# Patient Record
Sex: Female | Born: 1980 | Race: Black or African American | Hispanic: No | Marital: Married | Smoking: Never smoker
Health system: Southern US, Community
[De-identification: ages and names within clinical notes are randomized; demographics above are authoritative.]

## PROBLEM LIST (undated history)

## (undated) DIAGNOSIS — Z8481 Family history of carrier of genetic disease: Secondary | ICD-10-CM

## (undated) DIAGNOSIS — Z8041 Family history of malignant neoplasm of ovary: Secondary | ICD-10-CM

## (undated) DIAGNOSIS — F329 Major depressive disorder, single episode, unspecified: Secondary | ICD-10-CM

## (undated) DIAGNOSIS — F419 Anxiety disorder, unspecified: Secondary | ICD-10-CM

## (undated) DIAGNOSIS — Z9189 Other specified personal risk factors, not elsewhere classified: Secondary | ICD-10-CM

## (undated) DIAGNOSIS — B009 Herpesviral infection, unspecified: Secondary | ICD-10-CM

## (undated) DIAGNOSIS — Z803 Family history of malignant neoplasm of breast: Secondary | ICD-10-CM

## (undated) DIAGNOSIS — O139 Gestational [pregnancy-induced] hypertension without significant proteinuria, unspecified trimester: Secondary | ICD-10-CM

## (undated) DIAGNOSIS — Z1371 Encounter for nonprocreative screening for genetic disease carrier status: Secondary | ICD-10-CM

## (undated) DIAGNOSIS — L309 Dermatitis, unspecified: Secondary | ICD-10-CM

## (undated) DIAGNOSIS — S62109A Fracture of unspecified carpal bone, unspecified wrist, initial encounter for closed fracture: Secondary | ICD-10-CM

## (undated) DIAGNOSIS — Z800 Family history of malignant neoplasm of digestive organs: Secondary | ICD-10-CM

## (undated) DIAGNOSIS — F32A Depression, unspecified: Secondary | ICD-10-CM

## (undated) HISTORY — DX: Other specified personal risk factors, not elsewhere classified: Z91.89

## (undated) HISTORY — DX: Anxiety disorder, unspecified: F41.9

## (undated) HISTORY — DX: Depression, unspecified: F32.A

## (undated) HISTORY — DX: Family history of malignant neoplasm of digestive organs: Z80.0

## (undated) HISTORY — DX: Family history of malignant neoplasm of breast: Z80.3

## (undated) HISTORY — DX: Dermatitis, unspecified: L30.9

## (undated) HISTORY — PX: WRIST SURGERY: SHX841

## (undated) HISTORY — DX: Encounter for nonprocreative screening for genetic disease carrier status: Z13.71

## (undated) HISTORY — DX: Family history of carrier of genetic disease: Z84.81

## (undated) HISTORY — DX: Herpesviral infection, unspecified: B00.9

## (undated) HISTORY — PX: FRACTURE SURGERY: SHX138

## (undated) HISTORY — DX: Family history of malignant neoplasm of ovary: Z80.41

---
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
## 2003-05-19 ENCOUNTER — Other Ambulatory Visit: Payer: Self-pay

## 2009-04-18 HISTORY — PX: WRIST SURGERY: SHX841

## 2011-11-25 ENCOUNTER — Emergency Department: Payer: Self-pay | Admitting: Emergency Medicine

## 2012-02-25 ENCOUNTER — Emergency Department (HOSPITAL_COMMUNITY)
Admission: EM | Admit: 2012-02-25 | Discharge: 2012-02-25 | Disposition: A | Discharge status: Home / Self Care | Payer: 59 | Attending: Emergency Medicine | Admitting: Emergency Medicine

## 2012-02-25 ENCOUNTER — Emergency Department (HOSPITAL_COMMUNITY): Payer: 59

## 2012-02-25 ENCOUNTER — Encounter (HOSPITAL_COMMUNITY): Payer: Self-pay | Admitting: Emergency Medicine

## 2012-02-25 DIAGNOSIS — Y9289 Other specified places as the place of occurrence of the external cause: Secondary | ICD-10-CM | POA: Insufficient documentation

## 2012-02-25 DIAGNOSIS — S62109A Fracture of unspecified carpal bone, unspecified wrist, initial encounter for closed fracture: Secondary | ICD-10-CM | POA: Insufficient documentation

## 2012-02-25 DIAGNOSIS — R296 Repeated falls: Secondary | ICD-10-CM | POA: Insufficient documentation

## 2012-02-25 DIAGNOSIS — Y9351 Activity, roller skating (inline) and skateboarding: Secondary | ICD-10-CM | POA: Insufficient documentation

## 2012-02-25 HISTORY — DX: Fracture of unspecified carpal bone, unspecified wrist, initial encounter for closed fracture: S62.109A

## 2012-02-25 MED ORDER — OXYCODONE-ACETAMINOPHEN 5-325 MG PO TABS: 1.0000 | ORAL_TABLET | Freq: Four times a day (QID) | ORAL | Status: DC | PRN

## 2012-02-25 MED ORDER — OXYCODONE-ACETAMINOPHEN 5-325 MG PO TABS
2.0000 | ORAL_TABLET | Freq: Once | ORAL | Status: AC
  Administered 2012-02-25: 2 via ORAL
  Filled 2012-02-25: qty 2

## 2012-02-25 NOTE — Progress Notes (Signed)
Orthopedic Tech Progress Note Patient Details:  Angie Tucker 1980-05-29 [id number]  Ortho Devices Type of Ortho Device: Arm foam sling;Sugartong splint Ortho Device/Splint Location: left arm Ortho Device/Splint Interventions: Application   [name] 02/25/2012, 11:23 PM

## 2012-02-25 NOTE — ED Notes (Signed)
Patient reports that she went roller skating today and fell on her left wrist; patient reports pain and swelling.

## 2012-02-25 NOTE — ED Provider Notes (Signed)
Medical screening examination/treatment/procedure(s) were performed by non-physician practitioner and as supervising physician I was immediately available for consultation/collaboration.   Dione Booze, MD 02/25/12 [phone]

## 2012-02-25 NOTE — ED Notes (Signed)
Paged ortho tech to apply short arm splint and sling immobilizer.

## 2012-02-25 NOTE — ED Provider Notes (Addendum)
History     CSN: [id number]  Arrival date & time 02/25/12  2122   First MD Initiated Contact with Patient 02/25/12 2132      Chief Complaint  Patient presents with  . Wrist Injury    (Consider location/radiation/quality/duration/timing/severity/associated sxs/prior treatment) HPI Comments: Larey Seat while rollerskating  The history is provided by the patient.    Past Medical History  Diagnosis Date  . Broken wrist Right Wrist    History reviewed. No pertinent past surgical history.  History reviewed. No pertinent family history.  History  Substance Use Topics  . Smoking status: Never Smoker   . Smokeless tobacco: Not on file  . Alcohol Use: Yes     Comment: Occassional Use    OB History    Grav Para Term Preterm Abortions TAB SAB Ect Mult Living                  Review of Systems  Constitutional: Positive for activity change.  Gastrointestinal: Negative for nausea.  Musculoskeletal: Positive for joint swelling.  Skin: Negative for wound.    Allergies  Review of patient's allergies indicates no known allergies.  Home Medications   Current Outpatient Rx  Name  Route  Sig  Dispense  Refill  . ETONOGESTREL-ETHINYL ESTRADIOL 0.12-0.015 MG/24HR VA RING   Vaginal   Place 1 each vaginally every 28 (twenty-eight) days. Insert vaginally and leave in place for 3 consecutive weeks, then remove for 1 week.         . OXYCODONE-ACETAMINOPHEN 5-325 MG PO TABS   Oral   Take 1 tablet by mouth every 6 (six) hours as needed for pain.   20 tablet   0     BP 132/89  Pulse 95  Temp 97.5 F (36.4 C) (Oral)  SpO2 100%  Physical Exam  Constitutional: She appears well-developed and well-nourished.  HENT:  Head: Normocephalic and atraumatic.  Eyes: Pupils are equal, round, and reactive to light.  Neck: Normal range of motion.  Cardiovascular: Normal rate.   Musculoskeletal: She exhibits tenderness.       Left wrist: She exhibits decreased range of motion,  tenderness, bony tenderness, swelling and deformity.  Neurological: She is alert.  Skin: Skin is warm.    ED Course  SPLINT APPLICATION Date/Time: 02/25/2012 10:59 PM Performed by: Arman Filter Authorized by: Arman Filter Consent: Verbal consent obtained. Consent given by: patient Patient understanding: patient states understanding of the procedure being performed Patient identity confirmed: verbally with patient Time out: Immediately prior to procedure a "time out" was called to verify the correct patient, procedure, equipment, support staff and site/side marked as required. Location details: left wrist Splint type: sugar tong Post-procedure: The splinted body part was neurovascularly unchanged following the procedure. Patient tolerance: Patient tolerated the procedure well with no immediate complications. Comments: Slint placed by Ortho tech   (including critical care time)  Labs Reviewed - No data to display Dg Wrist Complete Left  02/25/2012  *RADIOLOGY REPORT*  Clinical Data: Post fall onto wrist  LEFT WRIST - COMPLETE 3+ VIEW  Comparison: None.  Findings: There is a displaced fracture of the distal radial metaphysis, apex ventral.  No definite intra-articular extension. Minimally displaced fracture of the ulnar styloid process.  There is expected adjacent soft tissue swelling and displacement of the pronator quadratus fat pad.  No definite dislocation.  No radiopaque foreign body.  IMPRESSION: 1.  Displaced fracture of the distal radial metaphysis, apex ventral, without definite intra-articular extension. 2.  Minimally-displaced fracture of the ulnar styloid process.   Original Report Authenticated By: Tacey Ruiz, MD      1. Wrist fracture       MDM   I spoke with Dr. Reita Cliche, who will review the film at this time.  He is requesting a sugar tong splint, be placed living and pain medication        Arman Filter, NP 02/25/12 2300  Arman Filter, NP 02/25/12  2300  Arman Filter, NP 02/25/12 4540  Arman Filter, NP 03/07/12 9811

## 2013-03-13 ENCOUNTER — Other Ambulatory Visit: Payer: Self-pay | Admitting: Family Medicine

## 2013-03-13 DIAGNOSIS — Z803 Family history of malignant neoplasm of breast: Secondary | ICD-10-CM

## 2013-03-21 ENCOUNTER — Other Ambulatory Visit: Payer: 59

## 2013-04-01 ENCOUNTER — Other Ambulatory Visit: Payer: 59

## 2013-04-01 ENCOUNTER — Ambulatory Visit
Admission: RE | Admit: 2013-04-01 | Discharge: 2013-04-01 | Disposition: A | Discharge status: Home / Self Care | Payer: 59 | Source: Ambulatory Visit | Attending: Family Medicine | Admitting: Family Medicine

## 2013-04-01 DIAGNOSIS — Z803 Family history of malignant neoplasm of breast: Secondary | ICD-10-CM

## 2013-04-01 MED ORDER — GADOBENATE DIMEGLUMINE 529 MG/ML IV SOLN
13.0000 mL | Freq: Once | INTRAVENOUS | Status: AC | PRN
  Administered 2013-04-01: 13 mL via INTRAVENOUS

## 2014-01-08 ENCOUNTER — Emergency Department (HOSPITAL_COMMUNITY): Payer: 59

## 2014-01-08 ENCOUNTER — Encounter (HOSPITAL_COMMUNITY): Payer: Self-pay | Admitting: Emergency Medicine

## 2014-01-08 ENCOUNTER — Emergency Department (HOSPITAL_COMMUNITY)
Admission: EM | Admit: 2014-01-08 | Discharge: 2014-01-08 | Disposition: A | Discharge status: Home / Self Care | Payer: 59 | Attending: Emergency Medicine | Admitting: Emergency Medicine

## 2014-01-08 DIAGNOSIS — Z8781 Personal history of (healed) traumatic fracture: Secondary | ICD-10-CM | POA: Diagnosis not present

## 2014-01-08 DIAGNOSIS — IMO0002 Reserved for concepts with insufficient information to code with codable children: Secondary | ICD-10-CM | POA: Insufficient documentation

## 2014-01-08 DIAGNOSIS — R002 Palpitations: Secondary | ICD-10-CM | POA: Diagnosis not present

## 2014-01-08 DIAGNOSIS — R000 Tachycardia, unspecified: Secondary | ICD-10-CM | POA: Insufficient documentation

## 2014-01-08 DIAGNOSIS — R7989 Other specified abnormal findings of blood chemistry: Secondary | ICD-10-CM | POA: Diagnosis not present

## 2014-01-08 DIAGNOSIS — Z79899 Other long term (current) drug therapy: Secondary | ICD-10-CM | POA: Diagnosis not present

## 2014-01-08 DIAGNOSIS — R748 Abnormal levels of other serum enzymes: Secondary | ICD-10-CM

## 2014-01-08 DIAGNOSIS — F411 Generalized anxiety disorder: Secondary | ICD-10-CM | POA: Insufficient documentation

## 2014-01-08 LAB — CBC WITH DIFFERENTIAL/PLATELET
Basophils Absolute: 0 10*3/uL (ref 0.0–0.1)
Basophils Relative: 0 % (ref 0–1)
Eosinophils Absolute: 0.1 10*3/uL (ref 0.0–0.7)
Eosinophils Relative: 1 % (ref 0–5)
HEMATOCRIT: 38.6 % (ref 36.0–46.0)
HEMOGLOBIN: 13.2 g/dL (ref 12.0–15.0)
LYMPHS ABS: 1.5 10*3/uL (ref 0.7–4.0)
LYMPHS PCT: 23 % (ref 12–46)
MCH: 30 pg (ref 26.0–34.0)
MCHC: 34.2 g/dL (ref 30.0–36.0)
MCV: 87.7 fL (ref 78.0–100.0)
MONO ABS: 0.4 10*3/uL (ref 0.1–1.0)
MONOS PCT: 5 % (ref 3–12)
NEUTROS ABS: 4.6 10*3/uL (ref 1.7–7.7)
NEUTROS PCT: 71 % (ref 43–77)
Platelets: 222 10*3/uL (ref 150–400)
RBC: 4.4 MIL/uL (ref 3.87–5.11)
RDW: 13.3 % (ref 11.5–15.5)
WBC: 6.6 10*3/uL (ref 4.0–10.5)

## 2014-01-08 LAB — D-DIMER, QUANTITATIVE: D-Dimer, Quant: 0.41 ug/mL-FEU (ref 0.00–0.48)

## 2014-01-08 LAB — COMPREHENSIVE METABOLIC PANEL
ALBUMIN: 3.4 g/dL — AB (ref 3.5–5.2)
ALK PHOS: 87 U/L (ref 39–117)
ALT: 122 U/L — ABNORMAL HIGH (ref 0–35)
ANION GAP: 16 — AB (ref 5–15)
AST: 117 U/L — ABNORMAL HIGH (ref 0–37)
BILIRUBIN TOTAL: 0.9 mg/dL (ref 0.3–1.2)
BUN: 7 mg/dL (ref 6–23)
CHLORIDE: 101 meq/L (ref 96–112)
CO2: 22 meq/L (ref 19–32)
Calcium: 8.5 mg/dL (ref 8.4–10.5)
Creatinine, Ser: 0.74 mg/dL (ref 0.50–1.10)
GLUCOSE: 130 mg/dL — AB (ref 70–99)
POTASSIUM: 3.5 meq/L — AB (ref 3.7–5.3)
Sodium: 139 mEq/L (ref 137–147)
Total Protein: 7.3 g/dL (ref 6.0–8.3)

## 2014-01-08 LAB — TSH: TSH: 1.45 u[IU]/mL (ref 0.350–4.500)

## 2014-01-08 LAB — I-STAT TROPONIN, ED: Troponin i, poc: 0 ng/mL (ref 0.00–0.08)

## 2014-01-08 MED ORDER — LORAZEPAM 2 MG/ML IJ SOLN
1.0000 mg | Freq: Once | INTRAMUSCULAR | Status: AC
  Administered 2014-01-08: 1 mg via INTRAVENOUS
  Filled 2014-01-08: qty 1

## 2014-01-08 MED ORDER — SODIUM CHLORIDE 0.9 % IV BOLUS (SEPSIS)
1000.0000 mL | Freq: Once | INTRAVENOUS | Status: AC
  Administered 2014-01-08: 1000 mL via INTRAVENOUS

## 2014-01-08 NOTE — Discharge Instructions (Signed)
Take your meds as prescribed.   Stay hydrated.   Your AST is 117 and ALT is 122, slightly elevated. You should follow up and get a recheck with your doctor regarding this.   Return to ER if you have palpitations, shortness of breath, chest pain.

## 2014-01-08 NOTE — ED Provider Notes (Signed)
CSN: [id number]     Arrival date & time 01/08/14  1714 History   First MD Initiated Contact with Patient 01/08/14 1715     Chief Complaint  Patient presents with  . Tachycardia     (Consider location/radiation/quality/duration/timing/severity/associated sxs/prior Treatment) The history is provided by the patient.  Angie Tucker is a 33 y.o. female here with palpitations, shortness of breath. Woke up from a nap around 3:30 PM today with palpitations and shortness of breath. She works as a Engineer, civil (consulting) and listened to her heart rate and noticed that it was high. Denies chest pain, just shortness of breath. Denies vomiting. On OCP but denies PE or DVT or recent travels. Went to urgent care and was noted to be tachy in 160s and hypertensive 200/100. Sent here for eval. Has hx of anxiety.    Past Medical History  Diagnosis Date  . Broken wrist Right Wrist   Past Surgical History  Procedure Laterality Date  . Fracture surgery     History reviewed. No pertinent family history. History  Substance Use Topics  . Smoking status: Never Smoker   . Smokeless tobacco: Not on file  . Alcohol Use: Yes     Comment: Occassional Use   OB History   Grav Para Term Preterm Abortions TAB SAB Ect Mult Living                 Review of Systems  Respiratory: Positive for shortness of breath.   Cardiovascular: Positive for palpitations.  All other systems reviewed and are negative.     Allergies  Review of patient's allergies indicates no known allergies.  Home Medications   Prior to Admission medications   Medication Sig Start Date End Date Taking? Authorizing Provider  etonogestrel-ethinyl estradiol (NUVARING) 0.12-0.015 MG/24HR vaginal ring Place 1 each vaginally every 28 (twenty-eight) days. Insert vaginally and leave in place for 3 consecutive weeks, then remove for 1 week.   Yes Historical Provider, MD  fluticasone (FLONASE) 50 MCG/ACT nasal spray Place 1 spray into both nostrils daily.   Yes  Historical Provider, MD  hydrOXYzine (ATARAX/VISTARIL) 25 MG tablet Take 25 mg by mouth 3 (three) times daily as needed for anxiety.   Yes Historical Provider, MD  PARoxetine (PAXIL) 20 MG tablet Take 20 mg by mouth daily.   Yes Historical Provider, MD   BP 164/85  Pulse 93  Temp(Src) 98.1 F (36.7 C) (Oral)  Resp 22  Ht  (1.6 m)  Wt 135 lb (61.236 kg)  BMI 23.92 kg/m2  SpO2 100% Physical Exam  Nursing note and vitals reviewed. Constitutional: She is oriented to person, place, and time. She appears well-developed and well-nourished.  Slightly anxious   HENT:  Head: Normocephalic.  Mouth/Throat: Oropharynx is clear and moist.  Eyes: Conjunctivae and EOM are normal. Pupils are equal, round, and reactive to light.  Neck: Normal range of motion. Neck supple.  Cardiovascular: Regular rhythm and normal heart sounds.   Tachycardic   Pulmonary/Chest: Effort normal and breath sounds normal. No respiratory distress. She has no wheezes. She has no rales.  Abdominal: Soft. Bowel sounds are normal. She exhibits no distension. There is no tenderness. There is no rebound and no guarding.  Musculoskeletal: Normal range of motion. She exhibits no edema and no tenderness.  Neurological: She is alert and oriented to person, place, and time. No cranial nerve deficit. Coordination normal.  Skin: Skin is warm and dry.  Psychiatric: She has a normal mood and affect. Her behavior  is normal. Judgment and thought content normal.    ED Course  Procedures (including critical care time) Labs Review Labs Reviewed  COMPREHENSIVE METABOLIC PANEL - Abnormal; Notable for the following:    Potassium 3.5 (*)    Glucose, Bld 130 (*)    Albumin 3.4 (*)    AST 117 (*)    ALT 122 (*)    Anion gap 16 (*)    All other components within normal limits  CBC WITH DIFFERENTIAL  D-DIMER, QUANTITATIVE  TSH  I-STAT TROPOININ, ED    Imaging Review Dg Chest 2 View  01/08/2014   CLINICAL DATA:  Tachycardia,  shortness of breath, nonsmoker  EXAM: CHEST  2 VIEW  COMPARISON:  05/02/2013  FINDINGS: The heart size and mediastinal contours are within normal limits. Both lungs are clear. The visualized skeletal structures are unremarkable.  IMPRESSION: No active cardiopulmonary disease.   Electronically Signed   By: Esperanza Heir M.D.   On: 01/08/2014 19:38     EKG Interpretation   Date/Time:  Wednesday January 08 2014 17:18:22 EDT Ventricular Rate:  105 PR Interval:  175 QRS Duration: 73 QT Interval:  331 QTC Calculation: 437 R Axis:   64 Text Interpretation:  Sinus tachycardia Probable left atrial enlargement  Anterior infarct, old No previous ECGs available Confirmed by Deijah Spikes  MD,  Brinnley Lacap ([id number]) on 01/08/2014 5:23:41 PM      MDM   Final diagnoses:  None    Angie Tucker is a 33 y.o. female here with palpitations, SOB. Likely anxiety. On OCP so will get d-dimer. Will check labs. I doubt ACS.   8:03 PM Labs showed minimally elevated AST and ALT. No abdominal pain or vomiting. I doubt acute chole. D-dimer neg. TSH nl. After 1L NS and ativan, HR in the 80s. I think likely anxiety. Request outpatient f/u for elevated LFTs.    Richardean Canal, MD 01/08/14 2004

## 2014-01-08 NOTE — ED Notes (Signed)
33 yo female from [facility] with tachycardia beginning at 3:30 pm. Per UCC HR 164 with bp 200/110 now 160/100. Per EMS ST and numbness on the right side of face. Pt alert and oriented. Denies any pain.

## 2014-07-22 ENCOUNTER — Encounter: Payer: Self-pay | Admitting: *Deleted

## 2015-01-16 ENCOUNTER — Ambulatory Visit: Payer: Self-pay | Admitting: Family

## 2015-01-16 ENCOUNTER — Other Ambulatory Visit: Payer: Self-pay | Admitting: Family

## 2015-01-16 ENCOUNTER — Encounter: Payer: Self-pay | Admitting: Physician Assistant

## 2015-01-16 VITALS — BP 138/90 | Temp 98.6°F

## 2015-01-16 DIAGNOSIS — F4321 Adjustment disorder with depressed mood: Secondary | ICD-10-CM

## 2015-01-16 MED ORDER — FLUOXETINE HCL 20 MG PO TABS: 20.0000 mg | ORAL_TABLET | Freq: Every day | ORAL | Status: DC

## 2015-01-16 NOTE — Progress Notes (Signed)
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 EAP offered and accepted. RTC one mo for follow up or prn. Continue exercise , healthy eating .

## 2015-02-08 ENCOUNTER — Emergency Department
Admission: EM | Admit: 2015-02-08 | Discharge: 2015-02-09 | Disposition: A | Discharge status: Home / Self Care | Payer: Self-pay | Attending: Emergency Medicine | Admitting: Emergency Medicine

## 2015-02-08 ENCOUNTER — Encounter: Payer: Self-pay | Admitting: *Deleted

## 2015-02-08 DIAGNOSIS — T50905A Adverse effect of unspecified drugs, medicaments and biological substances, initial encounter: Secondary | ICD-10-CM

## 2015-02-08 DIAGNOSIS — T783XXA Angioneurotic edema, initial encounter: Secondary | ICD-10-CM | POA: Insufficient documentation

## 2015-02-08 DIAGNOSIS — T450X5A Adverse effect of antiallergic and antiemetic drugs, initial encounter: Secondary | ICD-10-CM | POA: Insufficient documentation

## 2015-02-08 DIAGNOSIS — Z79899 Other long term (current) drug therapy: Secondary | ICD-10-CM | POA: Insufficient documentation

## 2015-02-08 MED ORDER — DIPHENHYDRAMINE HCL 50 MG/ML IJ SOLN
25.0000 mg | Freq: Once | INTRAMUSCULAR | Status: AC
  Administered 2015-02-08: 25 mg via INTRAVENOUS
  Filled 2015-02-08: qty 1

## 2015-02-08 MED ORDER — METHYLPREDNISOLONE 4 MG PO TBPK: ORAL_TABLET | ORAL | Status: DC

## 2015-02-08 MED ORDER — METOCLOPRAMIDE HCL 5 MG/ML IJ SOLN
INTRAMUSCULAR | Status: AC
Start: 2015-02-08 — End: 2015-02-08
  Administered 2015-02-08: 10 mg
  Filled 2015-02-08: qty 2

## 2015-02-08 MED ORDER — DEXAMETHASONE SODIUM PHOSPHATE 10 MG/ML IJ SOLN
10.0000 mg | Freq: Once | INTRAMUSCULAR | Status: AC
  Administered 2015-02-08: 10 mg via INTRAVENOUS
  Filled 2015-02-08: qty 1

## 2015-02-08 MED ORDER — SODIUM CHLORIDE 0.9 % IV BOLUS (SEPSIS)
1000.0000 mL | Freq: Once | INTRAVENOUS | Status: AC
  Administered 2015-02-08: 1000 mL via INTRAVENOUS

## 2015-02-08 MED ORDER — METOCLOPRAMIDE HCL 5 MG/ML IJ SOLN: 20.0000 mg | Freq: Once | INTRAVENOUS | Status: AC

## 2015-02-08 MED ORDER — HYDROXYZINE HCL 50 MG PO TABS: 50.0000 mg | ORAL_TABLET | Freq: Three times a day (TID) | ORAL | Status: DC | PRN

## 2015-02-08 NOTE — ED Notes (Signed)
Pt states she received a flu shot in her left arm. Last night she started having itching on her left arm and today started having a rash on her arms and legs. She had taken some benadryl for the same. About 8pm had onset of left lower lip swelling. Pt denies sob, swallowing, or chest discomfort. Last Benadryl at 6pm tonight. No acute distress noted.

## 2015-02-08 NOTE — ED Notes (Signed)
Pt states she got the flu shot yesterday and started getting spots that itched on her body but states it wasn't that bad. Pt states she went to ed to be seen was given benadryl and then went back to work but states they only get worse. Pt has red welts all over her body

## 2015-02-08 NOTE — ED Provider Notes (Signed)
[facility] Emergency Department Provider Note  ____________________________________________  Time seen: Approximately 10:12 PM  I have reviewed the triage vital signs and the nursing notes.   HISTORY  Chief Complaint Allergic Reaction    HPI Angie Tucker is a 34 y.o. female patient receive a flu shot in the left arm yesterday. Last night patient starteditching her left arm. They were at the patient knows a rash on her arms and legs. Patient took some Benadryl and went home from work. About 8 PM today the patient noticed lower lip edema. Patient denies dyspnea, dysphagia, or chest discomfort. Patient denies any pain with this complaint. Patient stated  no fever, nausea, vomiting, or diarrhea.   Past Medical History  Diagnosis Date  . Broken wrist Right Wrist    There are no active problems to display for this patient.   Past Surgical History  Procedure Laterality Date  . Fracture surgery      Current Outpatient Rx  Name  Route  Sig  Dispense  Refill  . etonogestrel-ethinyl estradiol (NUVARING) 0.12-0.015 MG/24HR vaginal ring   Vaginal   Place 1 each vaginally every 28 (twenty-eight) days. Insert vaginally and leave in place for 3 consecutive weeks, then remove for 1 week.         Marland Kitchen. FLUoxetine (PROZAC) 20 MG tablet   Oral   Take 1 tablet (20 mg total) by mouth daily.   30 tablet   0   . fluticasone (FLONASE) 50 MCG/ACT nasal spray   Each Nare   Place 1 spray into both nostrils daily.         . hydrOXYzine (ATARAX/VISTARIL) 25 MG tablet   Oral   Take 25 mg by mouth 3 (three) times daily as needed for anxiety.         . hydrOXYzine (ATARAX/VISTARIL) 50 MG tablet   Oral   Take 1 tablet (50 mg total) by mouth 3 (three) times daily as needed for itching.   15 tablet   0   . methylPREDNISolone (MEDROL DOSEPAK) 4 MG TBPK tablet      Take Tapered dose as directed   21 tablet   0     Allergies Review of patient's allergies  indicates no known allergies.  No family history on file.  Social History Social History  Substance Use Topics  . Smoking status: Never Smoker   . Smokeless tobacco: None  . Alcohol Use: Yes     Comment: Occassional Use    Review of Systems Constitutional: No fever/chills Eyes: No visual changes. ENT: No sore throat. Cardiovascular: Denies chest pain. Respiratory: Denies shortness of breath. Gastrointestinal: No abdominal pain.  No nausea, no vomiting.  No diarrhea.  No constipation. Genitourinary: Negative for dysuria. Musculoskeletal: Negative for back pain. Skin: Positive for rash and lower lip edema. Neurological: Negative for headaches, focal weakness or numbness. 10-point ROS otherwise negative.  ____________________________________________   PHYSICAL EXAM:  VITAL SIGNS: ED Triage Vitals  Enc Vitals Group     BP 02/08/15 2048 167/87 mmHg     Pulse Rate 02/08/15 2048 89     Resp 02/08/15 2048 16     Temp 02/08/15 2048 98.1 F (36.7 C)     Temp Source 02/08/15 2048 Oral     SpO2 02/08/15 2048 100 %     Weight 02/08/15 2048 168 lb (76.204 kg)     Height 02/08/15 2048 5\' 2"  (1.575 m)     Head Cir --  Peak Flow --      Pain Score 02/08/15 2050 0     Pain Loc --      Pain Edu? --      Excl. in GC? --     Constitutional: Alert and oriented. Well appearing and in no acute distress. Eyes: Conjunctivae are normal. PERRL. EOMI. Head: Atraumatic. Nose: No congestion/rhinnorhea. Mouth/Throat: Mucous membranes are moist.  Oropharynx non-erythematous. Neck: No stridor.  No cervical spine tenderness to palpation. Hematological/Lymphatic/Immunilogical: No cervical lymphadenopathy. Cardiovascular: Normal rate, regular rhythm. Grossly normal heart sounds.  Good peripheral circulation. Respiratory: Normal respiratory effort.  No retractions. Lungs CTAB. Gastrointestinal: Soft and nontender. No distention. No abdominal bruits. No CVA tenderness. Musculoskeletal: No  lower extremity tenderness nor edema.  No joint effusions. Neurologic:  Normal speech and language. No gross focal neurologic deficits are appreciated. No gait instability. Skin:  Skin is warm, dry and intact. No rash noted. Psychiatric: Mood and affect are normal. Speech and behavior are normal.  ____________________________________________   LABS (all labs ordered are listed, but only abnormal results are displayed)  Labs Reviewed - No data to display ____________________________________________  EKG   ____________________________________________  RADIOLOGY   ____________________________________________   PROCEDURES  Procedure(s) performed: None  Critical Care performed: No  ____________________________________________   INITIAL IMPRESSION / ASSESSMENT AND PLAN / ED COURSE  Pertinent labs & imaging results that were available during my care of the patient were reviewed by me and considered in my medical decision making (see chart for details).  Angioedema and medication reaction. Patient given IV Decadron, Benadryl, and Reglan. Patient state itching has resolved there is a grade 57 resolution of the angioedema of the lip. Patient will be discharged prescription for Atarax and prednisone. Patient given a work excuse and advised follow-up family doctor to advise him of this encounter. ____________________________________________   FINAL CLINICAL IMPRESSION(S) / ED DIAGNOSES  Final diagnoses:  Angioedema of lips, initial encounter  Medication reaction, initial encounter      Joni Reining, PA-C 02/08/15 2353  Darien Ramus, MD 02/08/15 [phone]

## 2015-02-08 NOTE — ED Notes (Signed)
Pt says she got her flu shot yesterday; soon after she began having a rash to her left arm where injection was given; now she is having swelling to her bottom left; denies difficulty breathing;

## 2015-02-08 NOTE — Discharge Instructions (Signed)
Angioedema  Angioedema is sudden puffiness (swelling), often of the skin. It can happen:  · On your face or privates (genitals).  · In your belly (abdomen) or other body parts.  It usually happens quickly and gets better in 1 or 2 days. It often starts at night and is found when you wake up. You may get red, itchy patches of skin (hives). Attacks can be dangerous if your breathing passages get puffy.  The condition may happen only once, or it can come back at random times. It may happen for several years before it goes away for good.  HOME CARE  · Only take medicines as told by your doctor.  · Always carry your emergency allergy medicines with you.  · Wear a medical bracelet as told by your doctor.  · Avoid things that you know will cause attacks (triggers).  GET HELP IF:  · You have another attack.  · Your attacks happen more often or get worse.  · The condition was passed to you by your parents and you want to have children.  GET HELP RIGHT AWAY IF:   · Your mouth, tongue, or lips are very puffy.  · You have trouble breathing.  · You have trouble swallowing.  · You pass out (faint).  MAKE SURE YOU:   · Understand these instructions.  · Will watch your condition.  · Will get help right away if you are not doing well or get worse.     This information is not intended to replace advice given to you by your health care provider. Make sure you discuss any questions you have with your health care provider.     Document Released: 03/23/2009 Document Revised: 01/23/2013 Document Reviewed: 11/26/2012  Elsevier Interactive Patient Education ©2016 [employer]

## 2015-02-20 ENCOUNTER — Ambulatory Visit: Payer: Self-pay | Admitting: Family

## 2015-02-20 ENCOUNTER — Encounter: Payer: Self-pay | Admitting: Family

## 2015-02-20 VITALS — BP 140/99 | HR 101 | Temp 98.4°F

## 2015-02-20 DIAGNOSIS — F4321 Adjustment disorder with depressed mood: Secondary | ICD-10-CM

## 2015-02-20 DIAGNOSIS — F5102 Adjustment insomnia: Secondary | ICD-10-CM

## 2015-02-20 MED ORDER — FLUOXETINE HCL 20 MG PO TABS: 20.0000 mg | ORAL_TABLET | Freq: Every day | ORAL | Status: DC

## 2015-02-20 MED ORDER — ALPRAZOLAM 0.5 MG PO TABS: 0.5000 mg | ORAL_TABLET | Freq: Two times a day (BID) | ORAL | Status: DC | PRN

## 2015-02-20 NOTE — Progress Notes (Signed)
S / mood  , depression better  , not as tearful, denies H& S ideas, school going well . Can not sleep due to thinking about situational things with her marriage breakup  O/ alert pleasant , verbalises well, less depressed appearing, anxious  VSS A/ situational depression  insomnia P continue Prozac 20 mg rx for xanex 0.5 mg one half to one tab bid prn #20 O RF  EAP encouraged. Continue cardio and healthy lifestyle behaviors. RTC one month.

## 2015-03-24 ENCOUNTER — Encounter: Payer: Self-pay | Admitting: Physician Assistant

## 2015-03-24 ENCOUNTER — Ambulatory Visit: Payer: Self-pay | Admitting: Physician Assistant

## 2015-03-24 VITALS — BP 130/80 | HR 89 | Temp 98.2°F

## 2015-03-24 DIAGNOSIS — J069 Acute upper respiratory infection, unspecified: Secondary | ICD-10-CM

## 2015-03-24 MED ORDER — AZITHROMYCIN 250 MG PO TABS: ORAL_TABLET | ORAL | Status: DC

## 2015-03-24 MED ORDER — BENZONATATE 200 MG PO CAPS: 200.0000 mg | ORAL_CAPSULE | Freq: Three times a day (TID) | ORAL | Status: DC | PRN

## 2015-03-24 NOTE — Progress Notes (Signed)
S: C/o runny nose and congestion for 4-5 days, no fever, chills, cp/sob, v/d; mucus was green this am but clear throughout the day, cough is sporadic, voice is hoarse Using otc meds:   O: PE: vitals wnl, nad,  perrl eomi, normocephalic, tms dull, nasal mucosa red and swollen, throat injected, neck supple no lymph, lungs c t a, cv rrr, neuro intact, voice is hoarse  A:  Acute uri   P: zpack, tessalon perls; drink fluids, continue regular meds , use otc meds of choice, return if not improving in 5 days, return earlier if worsening

## 2015-03-25 ENCOUNTER — Ambulatory Visit: Payer: Self-pay | Admitting: Physician Assistant

## 2015-03-25 ENCOUNTER — Encounter: Payer: Self-pay | Admitting: Physician Assistant

## 2015-03-25 VITALS — BP 130/90 | HR 80 | Temp 98.3°F

## 2015-03-25 DIAGNOSIS — J069 Acute upper respiratory infection, unspecified: Secondary | ICD-10-CM

## 2015-03-25 NOTE — Progress Notes (Signed)
S/ sore throat , temp 100 . 7 last nt on zpack   O/  VSS congested , Pharynx with increased PND, minimally inflamed  A/ Sore throat secondary to PND  P saline products , continue supportive care . Reassurance.

## 2015-05-15 ENCOUNTER — Encounter: Payer: Self-pay | Admitting: Physician Assistant

## 2015-05-15 ENCOUNTER — Ambulatory Visit: Payer: Self-pay | Admitting: Physician Assistant

## 2015-05-15 VITALS — BP 124/90 | HR 80 | Temp 98.8°F

## 2015-05-15 DIAGNOSIS — R509 Fever, unspecified: Secondary | ICD-10-CM

## 2015-05-15 DIAGNOSIS — J069 Acute upper respiratory infection, unspecified: Secondary | ICD-10-CM

## 2015-05-15 LAB — POCT INFLUENZA A/B
Influenza A, POC: NEGATIVE
Influenza B, POC: NEGATIVE

## 2015-05-15 LAB — POCT RAPID STREP A (OFFICE): Rapid Strep A Screen: NEGATIVE

## 2015-05-15 MED ORDER — HYDROCOD POLST-CPM POLST ER 10-8 MG/5ML PO SUER: 5.0000 mL | Freq: Two times a day (BID) | ORAL | Status: DC | PRN

## 2015-05-15 MED ORDER — FLUCONAZOLE 150 MG PO TABS: ORAL_TABLET | ORAL | Status: DC

## 2015-05-15 MED ORDER — CEFDINIR 300 MG PO CAPS: 300.0000 mg | ORAL_CAPSULE | Freq: Two times a day (BID) | ORAL | Status: DC

## 2015-05-15 NOTE — Progress Notes (Signed)
S: C/o runny nose and congestion for 3 days, ?fever, chills,  Denies cp/sob, v/d; mucus was green this am but clear throughout the day, cough is sporadic, sister has same sx  Using otc meds: robitussin  O: PE: vitals wnl, nad, perrl eomi, normocephalic, tms dull, nasal mucosa red and swollen, throat injected, neck supple no lymph, lungs c t a, cv rrr, neuro intact  A:  Acute uri   P: omnicef , diflucan, tussionex nr; drink fluids, continue regular meds , use otc meds of choice, return if not improving in 5 days, return earlier if worsening

## 2015-09-21 ENCOUNTER — Encounter: Payer: Self-pay | Admitting: Physician Assistant

## 2015-09-21 ENCOUNTER — Ambulatory Visit: Payer: Self-pay | Admitting: Physician Assistant

## 2015-09-21 VITALS — BP 140/94 | HR 100 | Temp 98.3°F

## 2015-09-21 DIAGNOSIS — F4321 Adjustment disorder with depressed mood: Secondary | ICD-10-CM

## 2015-09-21 MED ORDER — SERTRALINE HCL 50 MG PO TABS
50.0000 mg | ORAL_TABLET | Freq: Every day | ORAL | Status: DC
Start: 2015-09-21 — End: 2018-11-14

## 2015-09-21 MED ORDER — TRAZODONE HCL 150 MG PO TABS: 150.0000 mg | ORAL_TABLET | Freq: Every evening | ORAL | Status: DC | PRN

## 2015-09-21 NOTE — Progress Notes (Signed)
S: c/o anxiety/depression, has been on prozac since Aug, states she got better for awhile but never felt like the medication was really helping a lot, now the anxiety is increasing again and can't sleep bc is thinking about everything at night, problems with her ex husband are still going on, denies si/hi  O: vitals wnl, nad, lungs c t a, cv rrr, good mood/affect  A: anxiety/depression  P: stop prozac, switch to zoloft 50mg  qd, trazadone 150mg  qhs prn, f/u with EAP

## 2015-11-18 ENCOUNTER — Encounter: Payer: Self-pay | Admitting: Physician Assistant

## 2015-11-18 ENCOUNTER — Ambulatory Visit: Payer: Self-pay | Admitting: Physician Assistant

## 2015-11-18 VITALS — BP 100/65 | HR 85 | Temp 98.3°F

## 2015-11-18 DIAGNOSIS — N390 Urinary tract infection, site not specified: Secondary | ICD-10-CM

## 2015-11-18 LAB — POCT URINALYSIS DIPSTICK
Bilirubin, UA: NEGATIVE
GLUCOSE UA: NEGATIVE
KETONES UA: NEGATIVE
Nitrite, UA: POSITIVE
Spec Grav, UA: 1.02
Urobilinogen, UA: 2
pH, UA: 6

## 2015-11-18 MED ORDER — CIPROFLOXACIN HCL 250 MG PO TABS: 250.0000 mg | ORAL_TABLET | Freq: Two times a day (BID) | ORAL | 0 refills | Status: DC

## 2015-11-18 NOTE — Progress Notes (Signed)
S:  C/o uti sx for 2 days, burning, urgency, frequency, pressure,  denies vaginal discharge, abdominal pain or flank pain:  Remainder ros neg  O:  Vitals wnl, nad, no cva tenderness, back nontender, lungs c t a,cv rrr, abd soft nontender, bs normal, n/v intact, ua + nitrites, trace leuks, 1+ blood  A: uti  P: cipro 250mg  bid x 7d, increase water intake, add cranberry juice, return if not improving in 2 -3 days, return earlier if worsening, discussed pyelonephritis sx

## 2016-04-19 DIAGNOSIS — Z0000 Encounter for general adult medical examination without abnormal findings: Secondary | ICD-10-CM | POA: Diagnosis not present

## 2016-04-19 DIAGNOSIS — R87612 Low grade squamous intraepithelial lesion on cytologic smear of cervix (LGSIL): Secondary | ICD-10-CM | POA: Diagnosis not present

## 2016-04-19 DIAGNOSIS — F418 Other specified anxiety disorders: Secondary | ICD-10-CM | POA: Diagnosis not present

## 2016-04-19 DIAGNOSIS — Z3044 Encounter for surveillance of vaginal ring hormonal contraceptive device: Secondary | ICD-10-CM | POA: Diagnosis not present

## 2016-04-19 DIAGNOSIS — Z124 Encounter for screening for malignant neoplasm of cervix: Secondary | ICD-10-CM | POA: Diagnosis not present

## 2016-04-19 DIAGNOSIS — A6000 Herpesviral infection of urogenital system, unspecified: Secondary | ICD-10-CM | POA: Diagnosis not present

## 2016-06-15 ENCOUNTER — Encounter: Payer: Self-pay | Admitting: Physician Assistant

## 2016-06-15 ENCOUNTER — Ambulatory Visit: Payer: Self-pay | Admitting: Physician Assistant

## 2016-06-15 VITALS — BP 120/90 | HR 95 | Temp 98.8°F

## 2016-06-15 DIAGNOSIS — J0120 Acute ethmoidal sinusitis, unspecified: Secondary | ICD-10-CM

## 2016-06-15 MED ORDER — FEXOFENADINE-PSEUDOEPHED ER 60-120 MG PO TB12: 1.0000 | ORAL_TABLET | Freq: Two times a day (BID) | ORAL | 0 refills | Status: DC

## 2016-06-15 MED ORDER — BENZONATATE 100 MG PO CAPS: 100.0000 mg | ORAL_CAPSULE | Freq: Three times a day (TID) | ORAL | 0 refills | Status: DC | PRN

## 2016-06-15 MED ORDER — AMOXICILLIN 875 MG PO TABS: 875.0000 mg | ORAL_TABLET | Freq: Two times a day (BID) | ORAL | 0 refills | Status: DC

## 2016-06-15 NOTE — Progress Notes (Signed)
   Subjective:sinus congestion    Patient ID: Angie Tucker, female    DOB: [birth year], 36 y.o.   MRN: [id number]  HPI Patient c/o sinus and ear pressure. Post nasal drainage, and cough 2nd to drainage. Denies fever/chill. Denies N/V/D. No palliative measures for compliant.   Review of Systems    Negative except for compliant. Objective:   Physical Exam HEENT revealed thick nasal discharge, edematous nasal turbinates, and post nasal drainage. Neck supple w/o adenopathy. Lungs CTA, and Heart RRR.       Assessment & Plan:sinusitis  Amoxil, Allergra-D, and Tessalon. Follow up one week if no improvement.

## 2016-06-29 ENCOUNTER — Ambulatory Visit (INDEPENDENT_AMBULATORY_CARE_PROVIDER_SITE_OTHER): Payer: 59 | Admitting: Obstetrics and Gynecology

## 2016-06-29 ENCOUNTER — Encounter: Payer: Self-pay | Admitting: Obstetrics and Gynecology

## 2016-06-29 VITALS — BP 122/78 | HR 77 | Ht 62.0 in | Wt 176.0 lb

## 2016-06-29 DIAGNOSIS — R87612 Low grade squamous intraepithelial lesion on cytologic smear of cervix (LGSIL): Secondary | ICD-10-CM

## 2016-06-29 NOTE — Patient Instructions (Signed)

## 2016-06-29 NOTE — Progress Notes (Signed)
GYNECOLOGY CLINIC COLPOSCOPY PROCEDURE NOTE  36 y.o. G0P0000 here for colposcopy for low-grade squamous intraepithelial neoplasia (LGSIL - encompassing HPV,mild dysplasia,CIN I) pap smear on 04/19/2016. Discussed underlying role for HPV infection in the development of cervical dysplasia, its natural history and progression/regression, need for surveillance.  Is the patient  pregnant: No LMP: Patient's last menstrual period was 06/20/2016 (exact date). Smoking status:  Metrics: Intervention Frequency ACO  Documented Smoking Status Yearly  Screened one or more times in 24 months  Cessation Counseling or  Active cessation medication Past 24 months  Past 24 months   Guideline developer: UpToDate (See UpToDate for funding source) Date Released: 2014  Number current sexual partners:  1 Number of partners in lifetime:  3 High risk partner: No.   History of STD:  No.  Patient given informed consent, signed copy in the chart, time out was performed.  The patient was position in dorsal lithotomy position. Speculum was placed the cervix was visualized.   After application of acetic acid colposcopic inspection of the cervix was undertaken.   Colposcopy adequate, full visualization of transformation zone: Yes no visible lesions; corresponding biopsies obtained.   ECC specimen obtained:  Yes All specimens were labeled and sent to pathology.   Patient was given post procedure instructions.  Will follow up pathology and manage accordingly.  Routine preventative health maintenance measures emphasized.  Physical Exam  Genitourinary:     A total of 15 minutes were spent in face-to-face contact with the patient during this encounter with over half of that time devoted to counseling and coordination of care.  Vena AustriaAndreas Zyairah Wacha, MD, Merlinda FrederickFACOG [facility], [facility]

## 2016-07-01 ENCOUNTER — Encounter: Payer: Self-pay | Admitting: Obstetrics and Gynecology

## 2016-07-01 LAB — PATHOLOGY

## 2017-05-05 DIAGNOSIS — Z124 Encounter for screening for malignant neoplasm of cervix: Secondary | ICD-10-CM | POA: Diagnosis not present

## 2017-05-05 DIAGNOSIS — Z713 Dietary counseling and surveillance: Secondary | ICD-10-CM | POA: Diagnosis not present

## 2017-05-05 DIAGNOSIS — Z0000 Encounter for general adult medical examination without abnormal findings: Secondary | ICD-10-CM | POA: Diagnosis not present

## 2017-05-19 DIAGNOSIS — Z0000 Encounter for general adult medical examination without abnormal findings: Secondary | ICD-10-CM | POA: Diagnosis not present

## 2017-06-04 ENCOUNTER — Ambulatory Visit
Admission: EM | Admit: 2017-06-04 | Discharge: 2017-06-04 | Disposition: A | Discharge status: Home / Self Care | Payer: 59 | Attending: Family Medicine | Admitting: Family Medicine

## 2017-06-04 ENCOUNTER — Other Ambulatory Visit: Payer: Self-pay

## 2017-06-04 DIAGNOSIS — N3000 Acute cystitis without hematuria: Secondary | ICD-10-CM

## 2017-06-04 LAB — URINALYSIS, COMPLETE (UACMP) WITH MICROSCOPIC

## 2017-06-04 MED ORDER — NITROFURANTOIN MONOHYD MACRO 100 MG PO CAPS: 100.0000 mg | ORAL_CAPSULE | Freq: Two times a day (BID) | ORAL | 0 refills | Status: DC

## 2017-06-04 NOTE — ED Triage Notes (Signed)
Patient complains of urinary urgency, pain, and frequency that started last night. Patient did take AZO before visit today.

## 2017-06-04 NOTE — ED Provider Notes (Signed)
[facility]    CSN: [id number] Arrival date & time: 06/04/17  1240   History   Chief Complaint Chief Complaint  Patient presents with  . Urinary Frequency   HPI  37 year old female presents with concerns for UTI.  Patient reports urinary frequency, dysuria, and "pressure".  Started last night.  No associated fever.  No flank pain.  No nausea vomiting.  She is been using Azo with improvement in dysuria.  No other associated symptoms.  No other complaints or concerns at this time.  Past Medical History:  Diagnosis Date  . Broken wrist Right Wrist   Past Surgical History:  Procedure Laterality Date  . FRACTURE SURGERY     OB History    Gravida Para Term Preterm AB Living   0 0 0 0 0 0   SAB TAB Ectopic Multiple Live Births   0 0 0 0 0     Home Medications    Prior to Admission medications   Medication Sig Start Date End Date Taking? Authorizing Provider  etonogestrel-ethinyl estradiol (NUVARING) 0.12-0.015 MG/24HR vaginal ring Place 1 each vaginally every 28 (twenty-eight) days. Reported on 09/21/2015   Yes [provider]  sertraline (ZOLOFT) 50 MG tablet Take 1 tablet (50 mg total) by mouth daily. 09/21/15  Yes Fisher, Roselyn Bering, PA-C  traZODone (DESYREL) 150 MG tablet Take 1 tablet (150 mg total) by mouth at bedtime as needed for sleep. 09/21/15  Yes Fisher, Roselyn Bering, PA-C  nitrofurantoin, macrocrystal-monohydrate, (MACROBID) 100 MG capsule Take 1 capsule (100 mg total) by mouth 2 (two) times daily. 06/04/17   Tommie Sams, DO   Family History Family History  Problem Relation Age of Onset  . Breast cancer Mother 26  . Breast cancer Paternal Grandmother 61  . Uterine cancer Paternal Grandmother 58  . Kidney cancer Paternal Grandfather     Social History Social History   Tobacco Use  . Smoking status: Never Smoker  . Smokeless tobacco: Never Used  Substance Use Topics  . Alcohol use: Yes    Alcohol/week: 0.0 oz    Comment: occasionally  . Drug  use: No     Allergies   Amoxicillin   Review of Systems Review of Systems  Constitutional: Negative.   Gastrointestinal: Negative.   Genitourinary: Positive for dysuria and frequency.   Physical Exam Triage Vital Signs ED Triage Vitals  Enc Vitals Group     BP 06/04/17 1255 136/85     Pulse Rate 06/04/17 1255 94     Resp 06/04/17 1255 18     Temp 06/04/17 1255 98.6 F (37 C)     Temp Source 06/04/17 1255 Oral     SpO2 06/04/17 1255 98 %     Weight 06/04/17 1252 175 lb (79.4 kg)     Height 06/04/17 1252 5\' 2"  (1.575 m)     Head Circumference --      Peak Flow --      Pain Score 06/04/17 1252 2     Pain Loc --      Pain Edu? --      Excl. in GC? --    Updated Vital Signs BP 136/85 (BP Location: Right Arm)   Pulse 94   Temp 98.6 F (37 C) (Oral)   Resp 18   Ht 5\' 2"  (1.575 m)   Wt 175 lb (79.4 kg)   LMP 05/16/2017   SpO2 98%   BMI 32.01 kg/m   Physical Exam  Constitutional: She is  oriented to person, place, and time. She appears well-developed and well-nourished. No distress.  Cardiovascular: Normal rate and regular rhythm.  Murmur heard. Pulmonary/Chest: Effort normal and breath sounds normal. She has no wheezes. She has no rales.  Abdominal: Soft. She exhibits no distension. There is no tenderness.  Neurological: She is alert and oriented to person, place, and time.  Psychiatric: She has a normal mood and affect. Her behavior is normal.  Nursing note and vitals reviewed.  UC Treatments / Results  Labs (all labs ordered are listed, but only abnormal results are displayed) Labs Reviewed  URINALYSIS, COMPLETE (UACMP) WITH MICROSCOPIC - Abnormal; Notable for the following components:      Result Value   Color, Urine ORANGE (*)    APPearance HAZY (*)    Glucose, UA   (*)    Value: TEST NOT REPORTED DUE TO COLOR INTERFERENCE OF URINE PIGMENT   Hgb urine dipstick   (*)    Value: TEST NOT REPORTED DUE TO COLOR INTERFERENCE OF URINE PIGMENT   Bilirubin Urine    (*)    Value: TEST NOT REPORTED DUE TO COLOR INTERFERENCE OF URINE PIGMENT   Ketones, ur   (*)    Value: TEST NOT REPORTED DUE TO COLOR INTERFERENCE OF URINE PIGMENT   Protein, ur   (*)    Value: TEST NOT REPORTED DUE TO COLOR INTERFERENCE OF URINE PIGMENT   Nitrite   (*)    Value: TEST NOT REPORTED DUE TO COLOR INTERFERENCE OF URINE PIGMENT   Leukocytes, UA   (*)    Value: TEST NOT REPORTED DUE TO COLOR INTERFERENCE OF URINE PIGMENT   Squamous Epithelial / LPF 6-30 (*)    Bacteria, UA MANY (*)    All other components within normal limits  URINE CULTURE    EKG  EKG Interpretation None       Radiology No results found.  Procedures Procedures (including critical care time)  Medications Ordered in UC Medications - No data to display   Initial Impression / Assessment and Plan / UC Course  I have reviewed the triage vital signs and the nursing notes.  Pertinent labs & imaging results that were available during my care of the patient were reviewed by me and considered in my medical decision making (see chart for details).     37 year old female presents with UTI. Treating with macrobid. Sending urine culture.  Final Clinical Impressions(s) / UC Diagnoses   Final diagnoses:  Acute cystitis without hematuria    ED Discharge Orders        Ordered    nitrofurantoin, macrocrystal-monohydrate, (MACROBID) 100 MG capsule  2 times daily     06/04/17 1308     Controlled Substance Prescriptions  Controlled Substance Registry consulted? Not Applicable   Tommie SamsCook, Roxene Alviar G, DO 06/04/17 1317

## 2017-06-07 LAB — URINE CULTURE

## 2017-06-26 DIAGNOSIS — H5203 Hypermetropia, bilateral: Secondary | ICD-10-CM | POA: Diagnosis not present

## 2018-03-18 DIAGNOSIS — Z1371 Encounter for nonprocreative screening for genetic disease carrier status: Secondary | ICD-10-CM

## 2018-03-18 HISTORY — DX: Encounter for nonprocreative screening for genetic disease carrier status: Z13.71

## 2018-03-28 ENCOUNTER — Other Ambulatory Visit (HOSPITAL_COMMUNITY)
Admission: RE | Admit: 2018-03-28 | Discharge: 2018-03-28 | Disposition: A | Discharge status: Home / Self Care | Payer: 59 | Source: Ambulatory Visit | Attending: Obstetrics and Gynecology | Admitting: Obstetrics and Gynecology

## 2018-03-28 ENCOUNTER — Ambulatory Visit (INDEPENDENT_AMBULATORY_CARE_PROVIDER_SITE_OTHER): Payer: 59 | Admitting: Obstetrics and Gynecology

## 2018-03-28 ENCOUNTER — Encounter: Payer: Self-pay | Admitting: Obstetrics and Gynecology

## 2018-03-28 VITALS — BP 154/92 | HR 73 | Ht 62.0 in | Wt 205.0 lb

## 2018-03-28 DIAGNOSIS — Z01419 Encounter for gynecological examination (general) (routine) without abnormal findings: Secondary | ICD-10-CM | POA: Diagnosis not present

## 2018-03-28 DIAGNOSIS — Z3169 Encounter for other general counseling and advice on procreation: Secondary | ICD-10-CM | POA: Diagnosis not present

## 2018-03-28 DIAGNOSIS — Z8051 Family history of malignant neoplasm of kidney: Secondary | ICD-10-CM | POA: Diagnosis not present

## 2018-03-28 DIAGNOSIS — Z3044 Encounter for surveillance of vaginal ring hormonal contraceptive device: Secondary | ICD-10-CM

## 2018-03-28 DIAGNOSIS — Z803 Family history of malignant neoplasm of breast: Secondary | ICD-10-CM | POA: Diagnosis not present

## 2018-03-28 DIAGNOSIS — Z808 Family history of malignant neoplasm of other organs or systems: Secondary | ICD-10-CM | POA: Diagnosis not present

## 2018-03-28 DIAGNOSIS — Z124 Encounter for screening for malignant neoplasm of cervix: Secondary | ICD-10-CM | POA: Insufficient documentation

## 2018-03-28 DIAGNOSIS — Z8049 Family history of malignant neoplasm of other genital organs: Secondary | ICD-10-CM | POA: Diagnosis not present

## 2018-03-28 DIAGNOSIS — Z1239 Encounter for other screening for malignant neoplasm of breast: Secondary | ICD-10-CM

## 2018-03-28 DIAGNOSIS — Z8041 Family history of malignant neoplasm of ovary: Secondary | ICD-10-CM | POA: Diagnosis not present

## 2018-03-28 MED ORDER — ETONOGESTREL-ETHINYL ESTRADIOL 0.12-0.015 MG/24HR VA RING: 1.0000 | VAGINAL_RING | VAGINAL | 3 refills | Status: DC

## 2018-03-28 NOTE — Progress Notes (Signed)
Gynecology Annual Exam   PCP: Barry Dienes, NP  Chief Complaint:  Chief Complaint  Patient presents with  . Gynecologic Exam    discuss fertility    History of Present Illness: Patient is a 37 y.o. G0P0000 presents for annual exam. The patient has no complaints today.   LMP: Patient's last menstrual period was 02/26/2018. Average Interval: regular, 28 days Duration of flow: 5 days Heavy Menses: no Clots: no Intermenstrual Bleeding: no Postcoital Bleeding: no Dysmenorrhea: no  The patient is sexually active. She currently uses NuvaRing vaginal inserts for contraception. She denies dyspareunia.    There is notable family history of breast or ovarian cancer in her family.  The patient wears seatbelts: yes.   The patient has regular exercise: not asked.    The patient denies current symptoms of depression.    Review of Systems: Review of Systems  Constitutional: Negative for chills and fever.  HENT: Negative for congestion.   Respiratory: Negative for cough and shortness of breath.   Cardiovascular: Negative for chest pain and palpitations.  Gastrointestinal: Negative for abdominal pain, constipation, diarrhea, heartburn, nausea and vomiting.  Genitourinary: Negative for dysuria, frequency and urgency.  Skin: Negative for itching and rash.  Neurological: Negative for dizziness and headaches.  Endo/Heme/Allergies: Negative for polydipsia.  Psychiatric/Behavioral: Negative for depression.    Past Medical History:  Past Medical History:  Diagnosis Date  . Broken wrist Right Wrist    Past Surgical History:  Past Surgical History:  Procedure Laterality Date  . FRACTURE SURGERY      Gynecologic History:  Patient's last menstrual period was 02/26/2018. Contraception: NuvaRing vaginal inserts  Obstetric History: G0P0000  Family History:  Family History  Problem Relation Age of Onset  . Breast cancer Mother 5  . Pancreatic cancer Mother 55  . Ovarian cancer  Paternal Grandmother 79  . Uterine cancer Paternal Grandmother 32       BRCA positive  . Kidney cancer Paternal Grandfather 72  . Multiple myeloma Maternal Grandmother 75    Social History:  Social History   Socioeconomic History  . Marital status: Divorced    Spouse name: Not on file  . Number of children: Not on file  . Years of education: Not on file  . Highest education level: Not on file  Occupational History  . Not on file  Social Needs  . Financial resource strain: Not on file  . Food insecurity:    Worry: Not on file    Inability: Not on file  . Transportation needs:    Medical: Not on file    Non-medical: Not on file  Tobacco Use  . Smoking status: Never Smoker  . Smokeless tobacco: Never Used  Substance and Sexual Activity  . Alcohol use: Yes    Alcohol/week: 0.0 standard drinks    Comment: occasionally  . Drug use: No  . Sexual activity: Yes    Birth control/protection: Inserts    Comment: Nuvaring  Lifestyle  . Physical activity:    Days per week: Not on file    Minutes per session: Not on file  . Stress: Not on file  Relationships  . Social connections:    Talks on phone: Not on file    Gets together: Not on file    Attends religious service: Not on file    Active member of club or organization: Not on file    Attends meetings of clubs or organizations: Not on file    Relationship  status: Not on file  . Intimate partner violence:    Fear of current or ex partner: Not on file    Emotionally abused: Not on file    Physically abused: Not on file    Forced sexual activity: Not on file  Other Topics Concern  . Not on file  Social History Narrative   NP in ICU Pulminary medicine [employer]    Allergies:  Allergies  Allergen Reactions  . Amoxicillin Rash    Medications: Prior to Admission medications   Medication Sig Start Date End Date Taking? Authorizing Provider  etonogestrel-ethinyl estradiol (NUVARING) 0.12-0.015 MG/24HR vaginal ring Place 1  each vaginally every 28 (twenty-eight) days. Reported on 09/21/2015   Yes [provider]  sertraline (ZOLOFT) 50 MG tablet Take 1 tablet (50 mg total) by mouth daily. 09/21/15  Yes Fisher, Linden Dolin, PA-C  traZODone (DESYREL) 150 MG tablet Take 1 tablet (150 mg total) by mouth at bedtime as needed for sleep. Patient not taking: Reported on 03/28/2018 09/21/15   Versie Starks, PA-C    Physical Exam Vitals: Blood pressure (!) 154/92, pulse 73, height '5\' 2"'  (1.575 m), weight 205 lb (93 kg), last menstrual period 02/26/2018.  General: NAD HEENT: normocephalic, anicteric Thyroid: no enlargement, no palpable nodules Pulmonary: No increased work of breathing, CTAB Cardiovascular: RRR, distal pulses 2+ Breast: Breast symmetrical, no tenderness, no palpable nodules or masses, no skin or nipple retraction present, no nipple discharge.  No axillary or supraclavicular lymphadenopathy. Abdomen: NABS, soft, non-tender, non-distended.  Umbilicus without lesions.  No hepatomegaly, splenomegaly or masses palpable. No evidence of hernia  Genitourinary:  External: Normal external female genitalia.  Normal urethral meatus, normal Bartholin's and Skene's glands.    Vagina: Normal vaginal mucosa, no evidence of prolapse.    Cervix: Grossly normal in appearance, no bleeding  Uterus: Non-enlarged, mobile, normal contour.  No CMT  Adnexa: ovaries non-enlarged, no adnexal masses  Rectal: deferred  Lymphatic: no evidence of inguinal lymphadenopathy Extremities: no edema, erythema, or tenderness Neurologic: Grossly intact Psychiatric: mood appropriate, affect full  Female chaperone present for pelvic and breast  portions of the physical exam    Assessment: 37 y.o. G0P0000 routine annual exam  Plan: Problem List Items Addressed This Visit    None    Visit Diagnoses    Encounter for gynecological examination without abnormal finding    -  Primary   Relevant Orders   Anti mullerian hormone   Screening  for malignant neoplasm of cervix       Relevant Orders   Cytology - PAP   Breast screening       Encounter for surveillance of vaginal ring hormonal contraceptive device       Encounter for preconception consultation       Relevant Orders   Anti mullerian hormone      1) STI screening  was notoffered and therefore not obtained  2)  ASCCP guidelines and rational discussed.  Patient opts for every 3 years screening interval  3) Contraception - the patient is currently using  NuvaRing vaginal inserts.  She is attempting to conceive in the near future  4) Routine healthcare maintenance including cholesterol, diabetes screening discussed managed by PCP  5) Maternal history of breast cancer (38) as well as pancreatic cancer 64.  Paternal grandmother BRCA positive.    6) Interested in possibly conceiving, wants to know about any labs to check fertility.   - AMH level drawn  7) MyRisk testing discussed  8)  Return in about 1 year (around 03/29/2019) for annual.   Malachy Mood, MD, [place], [facility] 03/28/2018, 4:31 PM

## 2018-03-31 LAB — ANTI MULLERIAN HORMONE: ANTI-MULLERIAN HORMONE (AMH): 0.684 ng/mL

## 2018-04-02 LAB — CYTOLOGY - PAP
ADEQUACY: ABSENT
DIAGNOSIS: NEGATIVE

## 2018-04-09 ENCOUNTER — Encounter: Payer: Self-pay | Admitting: Obstetrics and Gynecology

## 2018-05-11 ENCOUNTER — Other Ambulatory Visit: Payer: Self-pay | Admitting: Obstetrics and Gynecology

## 2018-05-11 DIAGNOSIS — Z1239 Encounter for other screening for malignant neoplasm of breast: Secondary | ICD-10-CM

## 2018-05-11 DIAGNOSIS — Z1371 Encounter for nonprocreative screening for genetic disease carrier status: Secondary | ICD-10-CM

## 2018-05-11 DIAGNOSIS — Z9189 Other specified personal risk factors, not elsewhere classified: Secondary | ICD-10-CM | POA: Insufficient documentation

## 2018-09-14 ENCOUNTER — Ambulatory Visit (INDEPENDENT_AMBULATORY_CARE_PROVIDER_SITE_OTHER): Payer: 59 | Admitting: Family Medicine

## 2018-09-14 ENCOUNTER — Other Ambulatory Visit: Payer: Self-pay

## 2018-09-14 ENCOUNTER — Encounter: Payer: Self-pay | Admitting: Family Medicine

## 2018-09-14 DIAGNOSIS — B009 Herpesviral infection, unspecified: Secondary | ICD-10-CM

## 2018-09-14 MED ORDER — VALACYCLOVIR HCL 1 G PO TABS: 1000.0000 mg | ORAL_TABLET | Freq: Every day | ORAL | 3 refills | Status: DC

## 2018-09-14 NOTE — Progress Notes (Signed)
Patient ID: Angie Tucker, female   DOB: May 12, 1980, 38 y.o.   MRN: [id number]    Virtual Visit via video Note  This visit type was conducted due to national recommendations for restrictions regarding the COVID-19 pandemic (e.g. social distancing).  This format is felt to be most appropriate for this patient at this time.  All issues noted in this document were discussed and addressed.  No physical exam was performed (except for noted visual exam findings with Video Visits).   I connected with Marda Stalker today at  3:20 PM EDT by a video enabled telemedicine application and verified that I am speaking with the correct person using two identifiers. Location patient: home Location provider: [facility] Persons participating in the virtual visit: patient, provider  I discussed the limitations, risks, security and privacy concerns of performing an evaluation and management service by video and the availability of in person appointments. I also discussed with the patient that there may be a patient responsible charge related to this service. The patient expressed understanding and agreed to proceed.  HPI:  Patient and I connected via video for her to establish with new PCP.  The previous PCP she was seeing no longer accepts her insurance, so she needs a new PCP.  Currently she has no complaints and is feeling well.  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  Only request today is a refill on her Valtrex, takes 1000 g every day to prevent HSV flareup.  Has taken this medication for years without any problems.  She sees GYN regularly for Pap smear.  No fever or chills.  No body aches.  No shortness of breath or wheezing.  No chest pain no GI or GU issues.    ROS: See pertinent positives and negatives per HPI.  Past Medical History:  Diagnosis Date  . BRCA negative 03/2018   MyRisk neg  . Broken wrist Right Wrist  . Depression   . Family history of BRCA gene  positive    PGM with ovarian cancer  . Family history of breast cancer   . Family history of ovarian cancer   . Family history of pancreatic cancer   . Herpes   . Increased risk of breast cancer 03/2018   IBIS=22.7%    Past Surgical History:  Procedure Laterality Date  . FRACTURE SURGERY    . WRIST SURGERY  2011    Family History  Problem Relation Age of Onset  . Breast cancer Mother 36  . Pancreatic cancer Mother 52  . Cancer Mother   . Ovarian cancer Paternal Grandmother 72  . Uterine cancer Paternal Grandmother 51       BRCA positive  . Kidney cancer Paternal Grandfather 92  . Multiple myeloma Maternal Grandmother 75    Social History   Tobacco Use  . Smoking status: Never Smoker  . Smokeless tobacco: Never Used  Substance Use Topics  . Alcohol use: Not Currently    Alcohol/week: 0.0 standard drinks    Comment: occasionally    Current Outpatient Medications:  .  etonogestrel-ethinyl estradiol (NUVARING) 0.12-0.015 MG/24HR vaginal ring, Place 1 each vaginally every 21 ( twenty-one) days. Then leave out for 1 week, Disp: 3 each, Rfl: 3 .  sertraline (ZOLOFT) 50 MG tablet, Take 1 tablet (50 mg total) by mouth daily., Disp: 30 tablet, Rfl: 3 .  valACYclovir (VALTREX) 1000 MG tablet, Take 1 g by mouth 1 day or 1 dose., Disp: , Rfl:  EXAM:  GENERAL: alert, oriented, appears well and in no acute distress  HEENT: atraumatic, conjunttiva clear, no obvious abnormalities on inspection of external nose and ears  NECK: normal movements of the head and neck  LUNGS: on inspection no signs of respiratory distress, breathing rate appears normal, no obvious gross SOB, gasping or wheezing  CV: no obvious cyanosis  MS: moves all visible extremities without noticeable abnormality  PSYCH/NEURO: pleasant and cooperative, no obvious depression or anxiety, speech and thought processing grossly intact  ASSESSMENT AND PLAN:  Discussed the following assessment and plan:  HSV  infection - Plan: valACYclovir (VALTREX) 1000 MG tablet  Refill of Valtrex given for HSV flareup prevention.  Patient will follow-up here regularly every year for physical and whenever needed.  She will continue to see GYN for her pelvic exams.   I discussed the assessment and treatment plan with the patient. The patient was provided an opportunity to ask questions and all were answered. The patient agreed with the plan and demonstrated an understanding of the instructions.   The patient was advised to call back or seek an in-person evaluation if the symptoms worsen or if the condition fails to improve as anticipated.   Jodelle Green, FNP

## 2018-09-17 ENCOUNTER — Other Ambulatory Visit: Payer: Self-pay | Admitting: Obstetrics and Gynecology

## 2018-09-17 DIAGNOSIS — B009 Herpesviral infection, unspecified: Secondary | ICD-10-CM

## 2018-09-17 MED ORDER — VALACYCLOVIR HCL 1 G PO TABS: 1000.0000 mg | ORAL_TABLET | Freq: Every day | ORAL | 3 refills | Status: DC

## 2018-11-14 ENCOUNTER — Encounter: Payer: Self-pay | Admitting: Family Medicine

## 2018-11-14 ENCOUNTER — Other Ambulatory Visit: Payer: Self-pay | Admitting: Lab

## 2018-11-14 MED ORDER — SERTRALINE HCL 50 MG PO TABS: 50.0000 mg | ORAL_TABLET | Freq: Every day | ORAL | 1 refills | Status: DC

## 2018-11-21 ENCOUNTER — Ambulatory Visit: Payer: 59 | Admitting: Obstetrics and Gynecology

## 2019-05-07 ENCOUNTER — Other Ambulatory Visit: Payer: Self-pay | Admitting: Obstetrics and Gynecology

## 2019-05-07 DIAGNOSIS — Z1231 Encounter for screening mammogram for malignant neoplasm of breast: Secondary | ICD-10-CM

## 2019-05-10 ENCOUNTER — Other Ambulatory Visit: Payer: Self-pay

## 2019-05-10 ENCOUNTER — Ambulatory Visit
Admission: RE | Admit: 2019-05-10 | Discharge: 2019-05-10 | Disposition: A | Discharge status: Home / Self Care | Payer: 59 | Source: Ambulatory Visit

## 2019-05-10 DIAGNOSIS — Z1231 Encounter for screening mammogram for malignant neoplasm of breast: Secondary | ICD-10-CM

## 2019-05-13 ENCOUNTER — Other Ambulatory Visit: Payer: Self-pay | Admitting: Obstetrics and Gynecology

## 2019-05-13 DIAGNOSIS — R928 Other abnormal and inconclusive findings on diagnostic imaging of breast: Secondary | ICD-10-CM

## 2019-05-21 ENCOUNTER — Other Ambulatory Visit: Payer: Self-pay

## 2019-05-21 ENCOUNTER — Encounter: Payer: Self-pay | Admitting: Family

## 2019-05-21 ENCOUNTER — Ambulatory Visit (INDEPENDENT_AMBULATORY_CARE_PROVIDER_SITE_OTHER): Payer: 59 | Admitting: Family

## 2019-05-21 VITALS — Ht 62.0 in | Wt 190.0 lb

## 2019-05-21 DIAGNOSIS — F419 Anxiety disorder, unspecified: Secondary | ICD-10-CM | POA: Insufficient documentation

## 2019-05-21 DIAGNOSIS — F329 Major depressive disorder, single episode, unspecified: Secondary | ICD-10-CM | POA: Diagnosis not present

## 2019-05-21 DIAGNOSIS — Z9189 Other specified personal risk factors, not elsewhere classified: Secondary | ICD-10-CM | POA: Diagnosis not present

## 2019-05-21 DIAGNOSIS — F32A Depression, unspecified: Secondary | ICD-10-CM

## 2019-05-21 DIAGNOSIS — B009 Herpesviral infection, unspecified: Secondary | ICD-10-CM | POA: Diagnosis not present

## 2019-05-21 MED ORDER — SERTRALINE HCL 100 MG PO TABS: 100.0000 mg | ORAL_TABLET | Freq: Every day | ORAL | 3 refills | Status: DC

## 2019-05-21 MED ORDER — TRAZODONE HCL 50 MG PO TABS: 25.0000 mg | ORAL_TABLET | Freq: Every evening | ORAL | 1 refills | Status: DC | PRN

## 2019-05-21 MED ORDER — VALACYCLOVIR HCL 1 G PO TABS: 1000.0000 mg | ORAL_TABLET | Freq: Every day | ORAL | 3 refills | Status: DC

## 2019-05-21 NOTE — Assessment & Plan Note (Signed)
Discussed consult with Dr. Orlie Dakin in regards to surveillance, further genetic testing.  Referral is in place

## 2019-05-21 NOTE — Assessment & Plan Note (Signed)
Stable with suppression therapy. Refilled.

## 2019-05-21 NOTE — Assessment & Plan Note (Signed)
Stable although she is experienced grief over the past year and certainly stressful a line of work.  We will increase her Zoloft to 100 mg as she has been on the past we will also give her trazodone to take as needed as she has also used in the past.  She will let me know how she is doing.

## 2019-05-21 NOTE — Progress Notes (Signed)
Virtual Visit via Video Note  I connected with@  on 05/21/19 at  9:00 AM EST by a video enabled telemedicine application and verified that I am speaking with the correct person using two identifiers.  Location patient: home Location provider:work  Persons participating in the virtual visit: patient, provider  I discussed the limitations of evaluation and management by telemedicine and the availability of in person appointments. The patient expressed understanding and agreed to proceed.   HPI: Establish care Transfer care.  Overall feels well.  Works as a Designer, jewellery with pulmonary ICU.  Has been a stressful year.  She notes she also has lost her mother several months ago and grandmother complains after that.  This is been hard for her and she has not been able to take entirely from work. History of depression and anxiety.started Zoloft more for the depression, 3-4 years ago which has been helpful.  In the past she is on 100 mg dose and somehow or another prescription was sent for 50 mg.  Overall she is sleeping well however sometimes she has had trouble turning her mind off.  Had been on trazodone prn in the past. Currently scheduled for right breast ultrasound.  No breast pain, nipple inversion. Does SBE. Follows with Dr Georgianne Fick and has discussed genetics consult in the past  2014 MR BL breasts; right breast mass 2021 MM - further eval of right breast with [place], this is scheduled.    She would like refill of Valtrex for suppressive therapy   ROS: See pertinent positives and negatives per HPI.  Past Medical History:  Diagnosis Date  . BRCA negative 03/2018   MyRisk neg  . Broken wrist Right Wrist  . Depression   . Family history of BRCA gene positive    PGM with ovarian cancer  . Family history of breast cancer   . Family history of ovarian cancer   . Family history of pancreatic cancer   . Herpes   . Increased risk of breast cancer 03/2018   IBIS=22.7%    Past  Surgical History:  Procedure Laterality Date  . FRACTURE SURGERY    . WRIST SURGERY  2011    Family History  Problem Relation Age of Onset  . Breast cancer Mother 88  . Pancreatic cancer Mother 63  . Ovarian cancer Paternal Grandmother 51  . Uterine cancer Paternal Grandmother 55       BRCA positive  . Breast cancer Paternal Grandmother 84  . Kidney cancer Paternal Grandfather 73  . Multiple myeloma Maternal Grandmother 75    SOCIAL HX: never smoker   Current Outpatient Medications:  .  etonogestrel-ethinyl estradiol (NUVARING) 0.12-0.015 MG/24HR vaginal ring, Place 1 each vaginally every 21 ( twenty-one) days. Then leave out for 1 week, Disp: 3 each, Rfl: 3 .  sertraline (ZOLOFT) 100 MG tablet, Take 1 tablet (100 mg total) by mouth at bedtime., Disp: 90 tablet, Rfl: 3 .  valACYclovir (VALTREX) 1000 MG tablet, Take 1 tablet (1,000 mg total) by mouth daily., Disp: 90 tablet, Rfl: 3 .  traZODone (DESYREL) 50 MG tablet, Take 0.5-1 tablets (25-50 mg total) by mouth at bedtime as needed for sleep., Disp: 90 tablet, Rfl: 1  EXAM:  VITALS per patient if applicable:  GENERAL: alert, oriented, appears well and in no acute distress  HEENT: atraumatic, conjunttiva clear, no obvious abnormalities on inspection of external nose and ears  NECK: normal movements of the head and neck  LUNGS: on inspection no signs of respiratory  distress, breathing rate appears normal, no obvious gross SOB, gasping or wheezing  CV: no obvious cyanosis  MS: moves all visible extremities without noticeable abnormality  PSYCH/NEURO: pleasant and cooperative, no obvious depression or anxiety, speech and thought processing grossly intact  ASSESSMENT AND PLAN:  Discussed the following assessment and plan:  Anxiety and depression - Plan: sertraline (ZOLOFT) 100 MG tablet, traZODone (DESYREL) 50 MG tablet  HSV infection - Plan: valACYclovir (VALTREX) 1000 MG tablet  Increased risk of breast cancer -  Plan: Ambulatory referral to Hematology / Oncology  Herpes - Plan: valACYclovir (VALTREX) 1000 MG tablet Problem List Items Addressed This Visit      Other   Anxiety and depression - Primary    Stable although she is experienced grief over the past year and certainly stressful a line of work.  We will increase her Zoloft to 100 mg as she has been on the past we will also give her trazodone to take as needed as she has also used in the past.  She will let me know how she is doing.      Relevant Medications   sertraline (ZOLOFT) 100 MG tablet   traZODone (DESYREL) 50 MG tablet   Herpes    Stable with suppression therapy. Refilled.       Relevant Medications   valACYclovir (VALTREX) 1000 MG tablet   Increased risk of breast cancer    Discussed consult with Dr. Grayland Ormond in regards to surveillance, further genetic testing.  Referral is in place      Relevant Orders   Ambulatory referral to Hematology / Oncology    Other Visit Diagnoses    HSV infection       Relevant Medications   valACYclovir (VALTREX) 1000 MG tablet      -we discussed possible serious and likely etiologies, options for evaluation and workup, limitations of telemedicine visit vs in person visit, treatment, treatment risks and precautions. Pt prefers to treat via telemedicine empirically rather then risking or undertaking an in person visit at this moment. Patient agrees to seek prompt in person care if worsening, new symptoms arise, or if is not improving with treatment.   I discussed the assessment and treatment plan with the patient. The patient was provided an opportunity to ask questions and all were answered. The patient agreed with the plan and demonstrated an understanding of the instructions.   The patient was advised to call back or seek an in-person evaluation if the symptoms worsen or if the condition fails to improve as anticipated.   [name], FNP

## 2019-05-24 ENCOUNTER — Other Ambulatory Visit: Payer: 59

## 2019-05-29 ENCOUNTER — Encounter: Payer: Self-pay | Admitting: Oncology

## 2019-06-01 NOTE — Progress Notes (Signed)
[place] [facility]  [phone] [phone]) [phone]  ID: JANILAH HOJNACKI OB: [birth year]  MR#: [id number]  LKG#:[id number]  Patient Care Team: Burnard Hawthorne, FNP as PCP - General (Family Medicine)  .I connected with KEYONI LAPINSKI on 06/04/19 at  3:00 PM EST by video enabled telemedicine visit and verified that I am speaking with the correct person using two identifiers.   I discussed the limitations, risks, security and privacy concerns of performing an evaluation and management service by telemedicine and the availability of in-person appointments. I also discussed with the patient that there may be a patient responsible charge related to this service. The patient expressed understanding and agreed to proceed.   Other persons participating in the visit and their role in the encounter: Patient, MD.  Patient's location: Home. Provider's location: Clinic.  CHIEF COMPLAINT:  Increased risk of breast cancer.  INTERVAL HISTORY: Patient is a 39 year old female who was previously noted to have a greater than 20% risk of breast cancer lifetime.  Although her genetic testing is negative, she has a paternal grandmother that tested positive for BRCA.  Her mother was never tested, but had breast cancer at the age of 83 and died of pancreatic cancer at the age of 39.  She is referred to clinic to discuss additional prophylactic measures for her high risk breast cancer.  She currently feels well and is asymptomatic.  She has no neurologic complaints.  She denies any recent fevers or illnesses.  She has a good appetite and denies weight loss.  She has no chest pain, shortness of breath, cough, or hemoptysis.  She denies any nausea, vomiting, constipation, or diarrhea.  She has no urinary complaints.  Patient feels at her baseline offers no specific complaints today.  REVIEW OF SYSTEMS:   Review of Systems  Constitutional: Negative.  Negative for fever, malaise/fatigue and weight  loss.  Respiratory: Negative.  Negative for cough, hemoptysis and shortness of breath.   Cardiovascular: Negative.  Negative for chest pain and leg swelling.  Gastrointestinal: Negative.  Negative for abdominal pain, blood in stool, heartburn and melena.  Genitourinary: Negative.  Negative for frequency.  Musculoskeletal: Negative.  Negative for back pain.  Skin: Negative.  Negative for rash.  Neurological: Negative.  Negative for dizziness, seizures, weakness and headaches.  Psychiatric/Behavioral: Negative.  The patient is not nervous/anxious.     As per HPI. Otherwise, a complete review of systems is negative.  PAST MEDICAL HISTORY: Past Medical History:  Diagnosis Date  . BRCA negative 03/2018   MyRisk neg  . Broken wrist Right Wrist  . Depression   . Family history of BRCA gene positive    PGM with ovarian cancer  . Family history of breast cancer   . Family history of ovarian cancer   . Family history of pancreatic cancer   . Herpes   . Increased risk of breast cancer 03/2018   IBIS=22.7%    PAST SURGICAL HISTORY: Past Surgical History:  Procedure Laterality Date  . FRACTURE SURGERY    . WRIST SURGERY  2011    FAMILY HISTORY: Family History  Problem Relation Age of Onset  . Breast cancer Mother 28  . Pancreatic cancer Mother 28  . Ovarian cancer Paternal Grandmother 40  . Uterine cancer Paternal Grandmother 67       BRCA positive  . Breast cancer Paternal Grandmother 11  . Kidney cancer Paternal Grandfather 47  . Multiple myeloma Maternal Grandmother 21    ADVANCED DIRECTIVES (  Y/N):  N  HEALTH MAINTENANCE: Social History   Tobacco Use  . Smoking status: Never Smoker  . Smokeless tobacco: Never Used  Substance Use Topics  . Alcohol use: Not Currently    Alcohol/week: 0.0 standard drinks    Comment: occasionally  . Drug use: No     Colonoscopy:  PAP:  Bone density:  Lipid panel:  Allergies  Allergen Reactions  . Amoxicillin Rash     Current Outpatient Medications  Medication Sig Dispense Refill  . etonogestrel-ethinyl estradiol (NUVARING) 0.12-0.015 MG/24HR vaginal ring Place 1 each vaginally every 21 ( twenty-one) days. Then leave out for 1 week 3 each 3  . sertraline (ZOLOFT) 100 MG tablet Take 1 tablet (100 mg total) by mouth at bedtime. 90 tablet 3  . traZODone (DESYREL) 50 MG tablet Take 0.5-1 tablets (25-50 mg total) by mouth at bedtime as needed for sleep. 90 tablet 1  . valACYclovir (VALTREX) 1000 MG tablet Take 1 tablet (1,000 mg total) by mouth daily. 90 tablet 3   No current facility-administered medications for this visit.    OBJECTIVE: There were no vitals filed for this visit.   There is no height or weight on file to calculate BMI.    ECOG FS:0 - Asymptomatic  General: Well-developed, well-nourished, no acute distress. HEENT: Normocephalic. Neuro: Alert, answering all questions appropriately. Cranial nerves grossly intact. Psych: Normal affect.  LAB RESULTS:  Lab Results  Component Value Date   NA 139 01/08/2014   K 3.5 (L) 01/08/2014   CL 101 01/08/2014   CO2 22 01/08/2014   GLUCOSE 130 (H) 01/08/2014   BUN 7 01/08/2014   CREATININE 0.74 01/08/2014   CALCIUM 8.5 01/08/2014   PROT 7.3 01/08/2014   ALBUMIN 3.4 (L) 01/08/2014   AST 117 (H) 01/08/2014   ALT 122 (H) 01/08/2014   ALKPHOS 87 01/08/2014   BILITOT 0.9 01/08/2014   GFRNONAA >90 01/08/2014   GFRAA >90 01/08/2014    Lab Results  Component Value Date   WBC 6.6 01/08/2014   NEUTROABS 4.6 01/08/2014   HGB 13.2 01/08/2014   HCT 38.6 01/08/2014   MCV 87.7 01/08/2014   PLT 222 01/08/2014     STUDIES: MM 3D SCREEN BREAST BILATERAL  Result Date: 05/10/2019 CLINICAL DATA:  Screening. Strong family history of breast cancer in her mother at age 16 and in her paternal grandmother at age 73. Patient had likely benign RIGHT breast masses evaluated at [facility] mammography in 2014. EXAM: DIGITAL SCREENING BILATERAL MAMMOGRAM WITH  TOMO AND CAD COMPARISON:  Previous exams from [facility] in 2014. ACR Breast Density Category c: The breast tissue is heterogeneously dense, which may obscure small masses. FINDINGS: In the right breast, possible masses in the [place], likely the previously identified masses, require further evaluation. In the left breast, no findings suspicious for malignancy. Images were processed with CAD. IMPRESSION: Further evaluation is suggested for possible masses in the right breast. RECOMMENDATION: Ultrasound of the right breast. (Code:US-R-62M) The patient will be contacted regarding the findings, and additional imaging will be scheduled. BI-RADS CATEGORY  0: Incomplete. Need additional imaging evaluation and/or prior mammograms for comparison. Electronically Signed   By: Evangeline Dakin M.D.   On: 05/10/2019 13:04    ASSESSMENT: Increased risk of breast cancer.  PLAN:    1.  Increased risk of breast cancer: Patient noted to have a greater than 20% risk of breast cancer lifetime.  Although her genetic testing is negative, she has a paternal grandmother  that tested positive for BRCA.  Her mother was never tested, but had breast cancer at the age of 69 and died of pancreatic cancer at the age of 30.  Patient reports she has been vigilant with her mammograms and her most recent one on May 10, 2019 was reported BI-RADS 0.  Follow-up ultrasound on June 03, 2019 was reported as BI-RADS 3 recommending repeat in 6 months.  Patient has had breast MRI in the past, but given her high risk of developing breast cancer and her young age, it would be reasonable to alternate yearly mammograms with yearly MRI.  We also discussed at length initiating prophylactic tamoxifen for a total of 5 years.  Risks and benefits as well as side effects were discussed discussed in detail.  Patient wishes to think about this further and will call clinic with her decision.  No further follow-up is necessary.   Continue close follow-up with OB/GYN and primary care as scheduled.  I provided 60 minutes of face-to-face video visit time during this encounter which included chart review, counseling, and coordination of care as documented above.   Patient expressed understanding and was in agreement with this plan. She also understands that She can call clinic at any time with any questions, concerns, or complaints.    Lloyd Huger, MD   06/01/2019 10:29 AM

## 2019-06-03 ENCOUNTER — Ambulatory Visit
Admission: RE | Admit: 2019-06-03 | Discharge: 2019-06-03 | Disposition: A | Discharge status: Home / Self Care | Payer: 59 | Source: Ambulatory Visit | Attending: Obstetrics and Gynecology | Admitting: Obstetrics and Gynecology

## 2019-06-03 ENCOUNTER — Other Ambulatory Visit: Payer: Self-pay

## 2019-06-03 ENCOUNTER — Other Ambulatory Visit: Payer: Self-pay | Admitting: Obstetrics and Gynecology

## 2019-06-03 DIAGNOSIS — N6001 Solitary cyst of right breast: Secondary | ICD-10-CM | POA: Diagnosis not present

## 2019-06-03 DIAGNOSIS — R928 Other abnormal and inconclusive findings on diagnostic imaging of breast: Secondary | ICD-10-CM

## 2019-06-03 DIAGNOSIS — N6310 Unspecified lump in the right breast, unspecified quadrant: Secondary | ICD-10-CM

## 2019-06-04 ENCOUNTER — Inpatient Hospital Stay: Payer: 59 | Attending: Oncology | Admitting: Oncology

## 2019-06-04 ENCOUNTER — Encounter: Payer: Self-pay | Admitting: Oncology

## 2019-06-04 DIAGNOSIS — Z803 Family history of malignant neoplasm of breast: Secondary | ICD-10-CM | POA: Diagnosis not present

## 2019-06-04 DIAGNOSIS — Z800 Family history of malignant neoplasm of digestive organs: Secondary | ICD-10-CM

## 2019-06-04 DIAGNOSIS — Z808 Family history of malignant neoplasm of other organs or systems: Secondary | ICD-10-CM | POA: Diagnosis not present

## 2019-06-04 DIAGNOSIS — Z9189 Other specified personal risk factors, not elsewhere classified: Secondary | ICD-10-CM

## 2019-06-04 DIAGNOSIS — Z8481 Family history of carrier of genetic disease: Secondary | ICD-10-CM | POA: Diagnosis not present

## 2019-06-04 DIAGNOSIS — Z8051 Family history of malignant neoplasm of kidney: Secondary | ICD-10-CM | POA: Diagnosis not present

## 2019-06-04 DIAGNOSIS — Z8041 Family history of malignant neoplasm of ovary: Secondary | ICD-10-CM

## 2019-06-13 ENCOUNTER — Encounter: Payer: Self-pay | Admitting: Family

## 2019-07-16 ENCOUNTER — Encounter: Payer: Self-pay | Admitting: Family

## 2019-07-16 ENCOUNTER — Ambulatory Visit: Payer: 59 | Admitting: Family

## 2019-07-16 ENCOUNTER — Other Ambulatory Visit: Payer: Self-pay

## 2019-07-16 ENCOUNTER — Telehealth: Payer: Self-pay | Admitting: Family

## 2019-07-16 VITALS — BP 130/80 | HR 64 | Temp 96.4°F | Ht 62.0 in | Wt 197.0 lb

## 2019-07-16 DIAGNOSIS — R635 Abnormal weight gain: Secondary | ICD-10-CM

## 2019-07-16 DIAGNOSIS — F32A Depression, unspecified: Secondary | ICD-10-CM

## 2019-07-16 DIAGNOSIS — F419 Anxiety disorder, unspecified: Secondary | ICD-10-CM | POA: Diagnosis not present

## 2019-07-16 DIAGNOSIS — Z136 Encounter for screening for cardiovascular disorders: Secondary | ICD-10-CM

## 2019-07-16 DIAGNOSIS — F329 Major depressive disorder, single episode, unspecified: Secondary | ICD-10-CM

## 2019-07-16 DIAGNOSIS — Z9189 Other specified personal risk factors, not elsewhere classified: Secondary | ICD-10-CM

## 2019-07-16 LAB — LIPID PANEL
Cholesterol: 257 mg/dL — ABNORMAL HIGH (ref 0–200)
HDL: 57.5 mg/dL (ref >39.00)
LDL Cholesterol: 190 mg/dL — ABNORMAL HIGH (ref 0–99)
NonHDL: 199.7
Total CHOL/HDL Ratio: 4
Triglycerides: 47 mg/dL (ref 0.0–149.0)
VLDL: 9.4 mg/dL (ref 0.0–40.0)

## 2019-07-16 LAB — CBC WITH DIFFERENTIAL/PLATELET
Basophils Absolute: 0.1 10*3/uL (ref 0.0–0.1)
Basophils Relative: 0.8 % (ref 0.0–3.0)
Eosinophils Absolute: 0.1 10*3/uL (ref 0.0–0.7)
Eosinophils Relative: 1.3 % (ref 0.0–5.0)
HCT: 39.4 % (ref 36.0–46.0)
Hemoglobin: 13 g/dL (ref 12.0–15.0)
Lymphocytes Relative: 34.7 % (ref 12.0–46.0)
Lymphs Abs: 2.9 10*3/uL (ref 0.7–4.0)
MCHC: 33 g/dL (ref 30.0–36.0)
MCV: 89.1 fl (ref 78.0–100.0)
Monocytes Absolute: 0.5 10*3/uL (ref 0.1–1.0)
Monocytes Relative: 6.5 % (ref 3.0–12.0)
Neutro Abs: 4.8 10*3/uL (ref 1.4–7.7)
Neutrophils Relative %: 56.7 % (ref 43.0–77.0)
Platelets: 310 10*3/uL (ref 150.0–400.0)
RBC: 4.42 Mil/uL (ref 3.87–5.11)
RDW: 13.8 % (ref 11.5–15.5)
WBC: 8.4 10*3/uL (ref 4.0–10.5)

## 2019-07-16 LAB — COMPREHENSIVE METABOLIC PANEL
ALT: 22 U/L (ref 0–35)
AST: 22 U/L (ref 0–37)
Albumin: 4.5 g/dL (ref 3.5–5.2)
Alkaline Phosphatase: 97 U/L (ref 39–117)
BUN: 8 mg/dL (ref 6–23)
CO2: 26 mEq/L (ref 19–32)
Calcium: 9.9 mg/dL (ref 8.4–10.5)
Chloride: 101 mEq/L (ref 96–112)
Creatinine, Ser: 0.72 mg/dL (ref 0.40–1.20)
GFR: 109.33 mL/min (ref >60.00)
Glucose, Bld: 87 mg/dL (ref 70–99)
Potassium: 3.9 mEq/L (ref 3.5–5.1)
Sodium: 135 mEq/L (ref 135–145)
Total Bilirubin: 0.4 mg/dL (ref 0.2–1.2)
Total Protein: 8.2 g/dL (ref 6.0–8.3)

## 2019-07-16 LAB — VITAMIN D 25 HYDROXY (VIT D DEFICIENCY, FRACTURES): VITD: 25.05 ng/mL — ABNORMAL LOW (ref 30.00–100.00)

## 2019-07-16 LAB — HEMOGLOBIN A1C: Hgb A1c MFr Bld: 5.5 % (ref 4.6–6.5)

## 2019-07-16 LAB — TSH: TSH: 1.37 u[IU]/mL (ref 0.35–4.50)

## 2019-07-16 MED ORDER — BUPROPION HCL ER (XL) 150 MG PO TB24: ORAL_TABLET | ORAL | 3 refills | Status: DC

## 2019-07-16 NOTE — Progress Notes (Signed)
Subjective:    Patient ID: Angie Tucker, female    DOB: 1981/04/08, 39 y.o.   MRN: [id number]  CC: Angie Tucker is a 39 y.o. female who presents today for follow up.   HPI: Would like to discuss weight gain.  First vacation since [place] has started today; heading with family to [place] in [place].   GAD-feels well on  increased zoloft. Trazodone prn works well.   Due to work, may go long periods without eating. Breakfast is largest meal. Breakfast typically  kodiak waffle, sausage, or eggs  No history of seizure, eating disorder.  No alcohol use   Dr Grayland Ormond- 05/2019: it would be reasonable to alternate yearly mammograms with yearly MRI.   HISTORY:  Past Medical History:  Diagnosis Date  . BRCA negative 03/2018   MyRisk neg  . Broken wrist Right Wrist  . Depression   . Family history of BRCA gene positive    PGM with ovarian cancer  . Family history of breast cancer   . Family history of ovarian cancer   . Family history of pancreatic cancer   . Herpes    Past Surgical History:  Procedure Laterality Date  . FRACTURE SURGERY    . WRIST SURGERY  2011   Family History  Problem Relation Age of Onset  . Breast cancer Mother 63  . Pancreatic cancer Mother 11  . Ovarian cancer Paternal Grandmother 49  . Uterine cancer Paternal Grandmother 49       BRCA positive  . Breast cancer Paternal Grandmother 79  . Kidney cancer Paternal Grandfather 88  . Multiple myeloma Maternal Grandmother 60  . Pancreatic cancer Paternal Great-grandmother   . Pancreatic cancer Other     Allergies: Amoxicillin Current Outpatient Medications on File Prior to Visit  Medication Sig Dispense Refill  . etonogestrel-ethinyl estradiol (NUVARING) 0.12-0.015 MG/24HR vaginal ring Place 1 each vaginally every 21 ( twenty-one) days. Then leave out for 1 week 3 each 3  . sertraline (ZOLOFT) 100 MG tablet Take 1 tablet (100 mg total) by mouth at bedtime. 90 tablet 3  . traZODone (DESYREL) 50 MG tablet Take  0.5-1 tablets (25-50 mg total) by mouth at bedtime as needed for sleep. 90 tablet 1  . valACYclovir (VALTREX) 1000 MG tablet Take 1 tablet (1,000 mg total) by mouth daily. 90 tablet 3   No current facility-administered medications on file prior to visit.    Social History   Tobacco Use  . Smoking status: Never Smoker  . Smokeless tobacco: Never Used  Substance Use Topics  . Alcohol use: Not Currently    Alcohol/week: 0.0 standard drinks    Comment: occasionally  . Drug use: No    Review of Systems  Constitutional: Negative for chills and fever.  Respiratory: Negative for cough.   Cardiovascular: Negative for chest pain and palpitations.  Gastrointestinal: Negative for nausea and vomiting.  Neurological: Negative for seizures.  Psychiatric/Behavioral: Negative for sleep disturbance and suicidal ideas. The patient is not nervous/anxious.       Objective:    BP 130/80   Pulse 64   Temp (!) 96.4 F (35.8 C) (Temporal)   Ht '5\' 2"'  (1.575 m)   Wt 197 lb (89.4 kg)   SpO2 99%   BMI 36.03 kg/m  BP Readings from Last 3 Encounters:  07/16/19 130/80  03/28/18 (!) 154/92  06/04/17 136/85   Wt Readings from Last 3 Encounters:  07/16/19 197 lb (89.4 kg)  05/21/19 190 lb (86.2  kg)  03/28/18 205 lb (93 kg)    Physical Exam Vitals reviewed.  Constitutional:      Appearance: She is well-developed.  Eyes:     Conjunctiva/sclera: Conjunctivae normal.  Cardiovascular:     Rate and Rhythm: Normal rate and regular rhythm.     Pulses: Normal pulses.     Heart sounds: Normal heart sounds.  Pulmonary:     Effort: Pulmonary effort is normal.     Breath sounds: Normal breath sounds. No wheezing, rhonchi or rales.  Skin:    General: Skin is warm and dry.  Neurological:     Mental Status: She is alert.  Psychiatric:        Speech: Speech normal.        Behavior: Behavior normal.        Thought Content: Thought content normal.        Assessment & Plan:   Problem List Items  Addressed This Visit      Other   Anxiety and depression    Improved on current regimen.  We will continue      Relevant Medications   buPROPion (WELLBUTRIN XL) 150 MG 24 hr tablet   Increased risk of breast cancer    Discussed consult at length with patient today.  She will circle back with Dr. Grayland Ormond in regards to her plans for annual screening and whether she will alternate between mammogram, MRI or if she will plan to do both the same calendar year.      Weight gain    We agreed on trial of Wellbutrin.  Counseled on side effects and mechanism of action of medication.  We also decided referral to Redgie Grayer is appropriate as well.  Will follow      Relevant Medications   buPROPion (WELLBUTRIN XL) 150 MG 24 hr tablet   Other Relevant Orders   TSH   CBC with Differential/Platelet   Comprehensive metabolic panel   Hemoglobin A1c   VITAMIN D 25 Hydroxy (Vit-D Deficiency, Fractures)   Amb Ref to Medical Weight Management    Other Visit Diagnoses    Encounter for screening for cardiovascular disorders    -  Primary   Relevant Orders   Hemoglobin A1c   Lipid panel       I am having Angie Tucker start on buPROPion. I am also having her maintain her etonogestrel-ethinyl estradiol, sertraline, valACYclovir, and traZODone.   Meds ordered this encounter  Medications  . buPROPion (WELLBUTRIN XL) 150 MG 24 hr tablet    Sig: Start 150 mg ER PO qam, increase after 3 days to 300 mg qam.    Dispense:  60 tablet    Refill:  3    Order Specific Question:   Supervising Provider    Answer:   Crecencio Mc [[id number]]    Return precautions given.   Risks, benefits, and alternatives of the medications and treatment plan prescribed today were discussed, and patient expressed understanding.   Education regarding symptom management and diagnosis given to patient on AVS.  Continue to follow with Burnard Hawthorne, FNP for routine health maintenance.   Angie Tucker and I  agreed with plan.   [name], FNP

## 2019-07-16 NOTE — Assessment & Plan Note (Signed)
Improved on current regimen.  We will continue

## 2019-07-16 NOTE — Assessment & Plan Note (Signed)
Discussed consult at length with patient today.  She will circle back with Dr. Orlie Dakin in regards to her plans for annual screening and whether she will alternate between mammogram, MRI or if she will plan to do both the same calendar year.

## 2019-07-16 NOTE — Telephone Encounter (Signed)
Left voicemail for pt to call back and schedule 3 month follow up.

## 2019-07-16 NOTE — Assessment & Plan Note (Signed)
We agreed on trial of Wellbutrin.  Counseled on side effects and mechanism of action of medication.  We also decided referral to Orlene Plum is appropriate as well.  Will follow

## 2019-07-16 NOTE — Patient Instructions (Signed)
Lets do a trial of Wellbutrin.  I have also placed a referral to the [facility], medical weight loss Dr. Orlene Plum.  Please let me know if not hear from [place] in regards this referral in the next 7 days  Please let me know what you discuss and decide on going forward Dr. Orlie Dakin regards to annual MRI and annual mammogram or if you like to alternate.   Always a pleasure seeing you.     Enjoy your trip!!  Bupropion extended-release tablets (Depression/Mood Disorders) What is this medicine? BUPROPION (byoo PROE pee on) is used to treat depression. This medicine may be used for other purposes; ask your health care provider or pharmacist if you have questions. COMMON BRAND NAME(S): Aplenzin, Budeprion XL, Forfivo XL, Wellbutrin XL What should I tell my health care provider before I take this medicine? They need to know if you have any of these conditions:  an eating disorder, such as anorexia or bulimia  bipolar disorder or psychosis  diabetes or high blood sugar, treated with medication  glaucoma  head injury or brain tumor  heart disease, previous heart attack, or irregular heart beat  high blood pressure  kidney or liver disease  seizures (convulsions)  suicidal thoughts or a previous suicide attempt  Tourette's syndrome  weight loss  an unusual or allergic reaction to bupropion, other medicines, foods, dyes, or preservatives  breast-feeding  pregnant or trying to become pregnant How should I use this medicine? Take this medicine by mouth with a glass of water. Follow the directions on the prescription label. You can take it with or without food. If it upsets your stomach, take it with food. Do not crush, chew, or cut these tablets. This medicine is taken once daily at the same time each day. Do not take your medicine more often than directed. Do not stop taking this medicine suddenly except upon the advice of your doctor. Stopping this medicine too quickly  may cause serious side effects or your condition may worsen. A special MedGuide will be given to you by the pharmacist with each prescription and refill. Be sure to read this information carefully each time. Talk to your pediatrician regarding the use of this medicine in children. Special care may be needed. Overdosage: If you think you have taken too much of this medicine contact a poison control center or emergency room at once. NOTE: This medicine is only for you. Do not share this medicine with others. What if I miss a dose? If you miss a dose, skip the missed dose and take your next tablet at the regular time. Do not take double or extra doses. What may interact with this medicine? Do not take this medicine with any of the following medications:  linezolid  MAOIs like Azilect, Carbex, Eldepryl, Marplan, Nardil, and Parnate  methylene blue (injected into a vein)  other medicines that contain bupropion like Zyban This medicine may also interact with the following medications:  alcohol  certain medicines for anxiety or sleep  certain medicines for blood pressure like metoprolol, propranolol  certain medicines for depression or psychotic disturbances  certain medicines for HIV or AIDS like efavirenz, lopinavir, nelfinavir, ritonavir  certain medicines for irregular heart beat like propafenone, flecainide  certain medicines for Parkinson's disease like amantadine, levodopa  certain medicines for seizures like carbamazepine, phenytoin, phenobarbital  cimetidine  clopidogrel  cyclophosphamide  digoxin  furazolidone  isoniazid  nicotine  orphenadrine  procarbazine  steroid medicines like prednisone or cortisone  stimulant medicines for attention disorders, weight loss, or to stay awake  tamoxifen  theophylline  thiotepa  ticlopidine  tramadol  warfarin This list may not describe all possible interactions. Give your health care provider a list of all  the medicines, herbs, non-prescription drugs, or dietary supplements you use. Also tell them if you smoke, drink alcohol, or use illegal drugs. Some items may interact with your medicine. What should I watch for while using this medicine? Tell your doctor if your symptoms do not get better or if they get worse. Visit your doctor or healthcare provider for regular checks on your progress. Because it may take several weeks to see the full effects of this medicine, it is important to continue your treatment as prescribed by your doctor. This medicine may cause serious skin reactions. They can happen weeks to months after starting the medicine. Contact your healthcare provider right away if you notice fevers or flu-like symptoms with a rash. The rash may be red or purple and then turn into blisters or peeling of the skin. Or, you might notice a red rash with swelling of the face, lips or lymph nodes in your neck or under your arms. Patients and their families should watch out for new or worsening thoughts of suicide or depression. Also watch out for sudden changes in feelings such as feeling anxious, agitated, panicky, irritable, hostile, aggressive, impulsive, severely restless, overly excited and hyperactive, or not being able to sleep. If this happens, especially at the beginning of treatment or after a change in dose, call your healthcare provider. Avoid alcoholic drinks while taking this medicine. Drinking large amounts of alcoholic beverages, using sleeping or anxiety medicines, or quickly stopping the use of these agents while taking this medicine may increase your risk for a seizure. Do not drive or use heavy machinery until you know how this medicine affects you. This medicine can impair your ability to perform these tasks. Do not take this medicine close to bedtime. It may prevent you from sleeping. Your mouth may get dry. Chewing sugarless gum or sucking hard candy, and drinking plenty of water may  help. Contact your doctor if the problem does not go away or is severe. The tablet shell for some brands of this medicine does not dissolve. This is normal. The tablet shell may appear whole in the stool. This is not a cause for concern. What side effects may I notice from receiving this medicine? Side effects that you should report to your doctor or health care professional as soon as possible:  allergic reactions like skin rash, itching or hives, swelling of the face, lips, or tongue  breathing problems  changes in vision  confusion  elevated mood, decreased need for sleep, racing thoughts, impulsive behavior  fast or irregular heartbeat  hallucinations, loss of contact with reality  increased blood pressure  rash, fever, and swollen lymph nodes  redness, blistering, peeling or loosening of the skin, including inside the mouth  seizures  suicidal thoughts or other mood changes  unusually weak or tired  vomiting Side effects that usually do not require medical attention (report to your doctor or health care professional if they continue or are bothersome):  constipation  headache  loss of appetite  nausea  tremors  weight loss This list may not describe all possible side effects. Call your doctor for medical advice about side effects. You may report side effects to FDA at 1-800-FDA-1088. Where should I keep my medicine? Keep out of the  reach of children. Store at room temperature between 15 and 30 degrees C (59 and 86 degrees F). Throw away any unused medicine after the expiration date. NOTE: This sheet is a summary. It may not cover all possible information. If you have questions about this medicine, talk to your doctor, pharmacist, or health care provider.  2020 Elsevier/Gold Standard (2018-06-28 13:45:31)

## 2019-07-17 ENCOUNTER — Encounter: Payer: Self-pay | Admitting: Family

## 2019-08-27 ENCOUNTER — Encounter (INDEPENDENT_AMBULATORY_CARE_PROVIDER_SITE_OTHER): Payer: Self-pay

## 2019-09-04 ENCOUNTER — Ambulatory Visit (INDEPENDENT_AMBULATORY_CARE_PROVIDER_SITE_OTHER): Payer: 59 | Admitting: Family Medicine

## 2019-09-04 ENCOUNTER — Other Ambulatory Visit: Payer: Self-pay

## 2019-09-04 ENCOUNTER — Encounter (INDEPENDENT_AMBULATORY_CARE_PROVIDER_SITE_OTHER): Payer: Self-pay | Admitting: Family Medicine

## 2019-09-04 VITALS — BP 128/85 | HR 74 | Temp 97.9°F | Ht 62.0 in | Wt 199.0 lb

## 2019-09-04 DIAGNOSIS — Z6836 Body mass index (BMI) 36.0-36.9, adult: Secondary | ICD-10-CM

## 2019-09-04 DIAGNOSIS — E559 Vitamin D deficiency, unspecified: Secondary | ICD-10-CM | POA: Diagnosis not present

## 2019-09-04 DIAGNOSIS — G4709 Other insomnia: Secondary | ICD-10-CM | POA: Diagnosis not present

## 2019-09-04 DIAGNOSIS — F418 Other specified anxiety disorders: Secondary | ICD-10-CM | POA: Diagnosis not present

## 2019-09-04 DIAGNOSIS — R5383 Other fatigue: Secondary | ICD-10-CM

## 2019-09-04 DIAGNOSIS — Z0289 Encounter for other administrative examinations: Secondary | ICD-10-CM

## 2019-09-04 DIAGNOSIS — R0602 Shortness of breath: Secondary | ICD-10-CM

## 2019-09-04 DIAGNOSIS — Z9189 Other specified personal risk factors, not elsewhere classified: Secondary | ICD-10-CM | POA: Diagnosis not present

## 2019-09-04 NOTE — Progress Notes (Signed)
Dear Angie Tucker,   Thank you for referring Angie Tucker to our clinic. The following note includes my evaluation and treatment recommendations.  Chief Complaint:   OBESITY Angie Tucker (MR# [id number]) is a 39 y.o. female who presents for evaluation and treatment of obesity and related comorbidities. Current BMI is Body mass index is 36.4 kg/m. Angie Tucker has been struggling with her weight for many years and has been unsuccessful in either losing weight, maintaining weight loss, or reaching her healthy weight goal.  Angie Tucker is currently in the action stage of change and ready to dedicate time achieving and maintaining a healthier weight. Angie Tucker is interested in becoming our patient and working on intensive lifestyle modifications including (but not limited to) diet and exercise for weight loss.  Angie Tucker says she likes sweets.  She does not eat bread or potatoes.  For breakfast, she will eat in the cafeteria and have an omelet with veggies, ham, and shredded cheese with a protein pancake.  She will sleep until 4:30 or 5:00 pm.  Dinner consists of chicken with mushroom soup, roasted potatoes and broccoli with shredded cheese.  Angie Tucker's habits were reviewed today and are as follows: Her family occasionally eats meals together, she thinks her family will occasionally eat healthier with her, her desired weight loss is 47 pounds, she started gaining weight in her early 76s, her heaviest weight ever was 197 pounds, she craves sweets, she wakes up frequently in the middle of the night to eat, she skips lunch frequently, she is frequently drinking liquids with calories, she frequently makes poor food choices and she struggles with emotional eating.  Depression Screen Angie Tucker's Food and Mood (modified PHQ-9) score was 8.  Depression screen PHQ 2/9 09/04/2019  Decreased Interest 1  Down, Depressed, Hopeless 2  PHQ - 2 Score 3  Altered sleeping 0  Tired, decreased energy 1  Change in appetite 2  Feeling  bad or failure about yourself  2  Trouble concentrating 0  Moving slowly or fidgety/restless 0  Suicidal thoughts 0  PHQ-9 Score 8  Difficult doing work/chores Not difficult at all   Subjective:   1. Other fatigue Angie Tucker denies daytime somnolence and reports waking up still tired. Patent has a history of symptoms of snoring. Angie Tucker generally gets 4 hours of sleep per night, and states that she has poor sleep quality. Snoring is present. Apneic episodes are not present. Epworth Sleepiness Score is 3.  2. SOB (shortness of breath) on exertion Angie Tucker notes increasing shortness of breath with exercising and seems to be worsening over time with weight gain. She notes getting out of breath sooner with activity than she used to. This has gotten worse recently. Angie Tucker denies shortness of breath at rest or orthopnea.  3. Other insomnia Angie Tucker says she has a prescription for trazodone.  She works 3rd shift and has poor sleep.  She uses trazodone 2 times a week.  4. Vitamin D deficiency Angie Tucker's Vitamin D level was 25.05 on 07/16/2019. She is currently taking vitamin D. She denies nausea, vomiting or muscle weakness.  She endorses fatigue.  5. Depression with anxiety Angie Tucker is taking Wellbutrin XL.  She was previously on Zoloft (she liked this).  She says that her symptoms are well-controlled on Wellbutrin.  6. At risk for osteoporosis Angie Tucker is at higher risk of osteopenia and osteoporosis due to Vitamin D deficiency.   Assessment/Plan:   1. Other fatigue Angie Tucker does feel that her weight is causing her energy  to be lower than it should be. Fatigue may be related to obesity, depression or many other causes. Labs will be ordered, and in the meanwhile, Patt will focus on self care including making healthy food choices, increasing physical activity and focusing on stress reduction. - EKG 12-Lead - Insulin, random - Vitamin B12 - Folate - T4 - T3  2. SOB (shortness of breath) on exertion Angie Tucker does feel that she  gets out of breath more easily that she used to when she exercises. Angie Tucker's shortness of breath appears to be obesity related and exercise induced. She has agreed to work on weight loss and gradually increase exercise to treat her exercise induced shortness of breath. Will continue to monitor closely. - Insulin, random - Vitamin B12 - Folate - T4 - T3  3. Other insomnia The problem of recurrent insomnia was discussed. Orders and follow up as documented in patient record. Counseling: Intensive lifestyle modifications are the first line treatment for this issue. We discussed several lifestyle modifications today and she will continue to work on diet, exercise and weight loss efforts.   Counseling  Limit or avoid alcohol, caffeinated beverages, and cigarettes, especially close to bedtime.   Do not eat a large meal or eat spicy foods right before bedtime. This can lead to digestive discomfort that can make it hard for you to sleep.  Keep a sleep diary to help you and your health care provider figure out what could be causing your insomnia.  . Make your bedroom a dark, comfortable place where it is easy to fall asleep. ? Put up shades or blackout curtains to block light from outside. ? Use a white noise machine to block noise. ? Keep the temperature cool. . Limit screen use before bedtime. This includes: ? Watching TV. ? Using your smartphone, tablet, or computer. . Stick to a routine that includes going to bed and waking up at the same times every day and night. This can help you fall asleep faster. Consider making a quiet activity, such as reading, part of your nighttime routine. . Try to avoid taking naps during the day so that you sleep better at night. . Get out of bed if you are still awake after 15 minutes of trying to sleep. Keep the lights down, but try reading or doing a quiet activity. When you feel sleepy, go back to bed.  4. Vitamin D deficiency Low Vitamin D level contributes to  fatigue and are associated with obesity, breast, and colon cancer. She agrees to continue to take Vitamin D and will follow-up for routine testing of Vitamin D, at least 2-3 times per year to avoid over-replacement.  5. Depression with anxiety Behavior modification techniques were discussed today to help Angie Tucker deal with her emotional/non-hunger eating behaviors.  Orders and follow up as documented in patient record.   6. At risk for osteoporosis Angie Tucker was given approximately 15 minutes of osteoporosis prevention counseling today. Angie Tucker is at risk for osteopenia and osteoporosis due to her Vitamin D deficiency. She was encouraged to take her Vitamin D and follow her higher calcium diet and increase strengthening exercise to help strengthen her bones and decrease her risk of osteopenia and osteoporosis.  Repetitive spaced learning was employed today to elicit superior memory formation and behavioral change.  7. Class 2 severe obesity with serious comorbidity and body mass index (BMI) of 36.0 to 36.9 in adult, unspecified obesity type (HCC) Angie Tucker is currently in the action stage of change and her goal  is to continue with weight loss efforts. I recommend Angie Tucker begin the structured treatment plan as follows:  She has agreed to the Category 3 Plan.  Exercise goals: As is.   Behavioral modification strategies: increasing lean protein intake, meal planning and cooking strategies, keeping healthy foods in the home and planning for success.  She was informed of the importance of frequent follow-up visits to maximize her success with intensive lifestyle modifications for her multiple health conditions. She was informed we would discuss her lab results at her next visit unless there is a critical issue that needs to be addressed sooner. Angie Tucker agreed to keep her next visit at the agreed upon time to discuss these results.  Objective:   Blood pressure 128/85, pulse 74, temperature 97.9 F (36.6 C), temperature  source Oral, height 5\' 2"  (1.575 m), weight 199 lb (90.3 kg), last menstrual period 08/24/2019, SpO2 100 %. Body mass index is 36.4 kg/m.  EKG: Normal sinus rhythm, rate 72 bpm.  Indirect Calorimeter completed today shows a VO2 of 275 and a REE of 1921.  Her calculated basal metabolic rate is 10/24/2019 thus her basal metabolic rate is better than expected.  General: Cooperative, alert, well developed, in no acute distress. HEENT: Conjunctivae and lids unremarkable. Cardiovascular: Regular rhythm.  Lungs: Normal work of breathing. Neurologic: No focal deficits.   Lab Results  Component Value Date   CREATININE 0.72 07/16/2019   BUN 8 07/16/2019   NA 135 07/16/2019   K 3.9 07/16/2019   CL 101 07/16/2019   CO2 26 07/16/2019   Lab Results  Component Value Date   ALT 22 07/16/2019   AST 22 07/16/2019   ALKPHOS 97 07/16/2019   BILITOT 0.4 07/16/2019   Lab Results  Component Value Date   HGBA1C 5.5 07/16/2019   Lab Results  Component Value Date   TSH 1.37 07/16/2019   Lab Results  Component Value Date   CHOL 257 (H) 07/16/2019   HDL 57.50 07/16/2019   LDLCALC 190 (H) 07/16/2019   TRIG 47.0 07/16/2019   CHOLHDL 4 07/16/2019   Lab Results  Component Value Date   WBC 8.4 07/16/2019   HGB 13.0 07/16/2019   HCT 39.4 07/16/2019   MCV 89.1 07/16/2019   PLT 310.0 07/16/2019   Attestation Statements:   This is the patient's first visit at [facility]. The patient's NEW PATIENT PACKET was reviewed at length. Included in the packet: current and past health history, medications, allergies, ROS, gynecologic history (women only), surgical history, family history, social history, weight history, weight loss surgery history (for those that have had weight loss surgery), nutritional evaluation, mood and food questionnaire, PHQ9, Epworth questionnaire, sleep habits questionnaire, patient life and health improvement goals questionnaire. These will all be scanned into the  patient's chart under media.   During the visit, I independently reviewed the patient's EKG, bioimpedance scale results, and indirect calorimeter results. I used this information to tailor a meal plan for the patient that will help her to lose weight and will improve her obesity-related conditions going forward. I performed a medically necessary appropriate examination and/or evaluation. I discussed the assessment and treatment plan with the patient. The patient was provided an opportunity to ask questions and all were answered. The patient agreed with the plan and demonstrated an understanding of the instructions. Labs were ordered at this visit and will be reviewed at the next visit unless more critical results need to be addressed immediately. Clinical information was updated and  documented in the EMR.   Time spent on visit including pre-visit chart review and post-visit care was 45 minutes.   A separate 15 minutes was spent on risk counseling (see above).    I, Insurance claims handler, CMA, am acting as transcriptionist for Reuben Likes, MD.  I have reviewed the above documentation for accuracy and completeness, and I agree with the above. - Katherina Mires, MD

## 2019-09-05 LAB — VITAMIN B12: Vitamin B-12: 797 pg/mL (ref 232–1245)

## 2019-09-05 LAB — T3: T3, Total: 150 ng/dL (ref 71–180)

## 2019-09-05 LAB — FOLATE: Folate: 9.1 ng/mL (ref >3.0)

## 2019-09-05 LAB — T4: T4, Total: 7.5 ug/dL (ref 4.5–12.0)

## 2019-09-05 LAB — INSULIN, RANDOM: INSULIN: 12.3 u[IU]/mL (ref 2.6–24.9)

## 2019-09-18 ENCOUNTER — Other Ambulatory Visit: Payer: Self-pay

## 2019-09-18 ENCOUNTER — Encounter (INDEPENDENT_AMBULATORY_CARE_PROVIDER_SITE_OTHER): Payer: Self-pay | Admitting: Family Medicine

## 2019-09-18 ENCOUNTER — Ambulatory Visit (INDEPENDENT_AMBULATORY_CARE_PROVIDER_SITE_OTHER): Payer: 59 | Admitting: Family Medicine

## 2019-09-18 VITALS — BP 122/80 | HR 101 | Temp 98.4°F | Ht 62.0 in | Wt 195.0 lb

## 2019-09-18 DIAGNOSIS — E8881 Metabolic syndrome: Secondary | ICD-10-CM | POA: Diagnosis not present

## 2019-09-18 DIAGNOSIS — E559 Vitamin D deficiency, unspecified: Secondary | ICD-10-CM | POA: Diagnosis not present

## 2019-09-18 DIAGNOSIS — Z9189 Other specified personal risk factors, not elsewhere classified: Secondary | ICD-10-CM | POA: Diagnosis not present

## 2019-09-18 DIAGNOSIS — Z6835 Body mass index (BMI) 35.0-35.9, adult: Secondary | ICD-10-CM | POA: Diagnosis not present

## 2019-09-18 MED ORDER — VITAMIN D (ERGOCALCIFEROL) 1.25 MG (50000 UNIT) PO CAPS: 50000.0000 [IU] | ORAL_CAPSULE | ORAL | 0 refills | Status: DC

## 2019-09-18 NOTE — Progress Notes (Signed)
Chief Complaint:   OBESITY GIULIANNA Tucker is here to discuss her progress with her obesity treatment plan along with follow-up of her obesity related diagnoses. Angie Tucker is on the Category 3 Plan and states she is following her eating plan approximately 80% of the time. Angie Tucker states she is riding elliptical 4 miles 4 times per week and working with weights 45 minutes 2-3 times per week.  Today's visit was #: 2 Starting weight: 199 lbs Starting date: 09/04/2019 Today's weight: 195 lbs Today's date: 09/18/2019 Total lbs lost to date: 4 Total lbs lost since last in-office visit: 4  Interim History: [name] felt the quantity of food was significant and states the first few days were the hardest. She often did eggs for breakfast; sandwich at lunch with strawberries with cottage cheese; [place] bar for snack, strawberry and cottage cheese, 70 calorie brownie or string cheese. She denies cravings.  Subjective:   Vitamin D deficiency. No nausea, vomiting, or muscle weakness, but endorses fatigue. Angie Tucker is not on supplementation. Last Vitamin D was 25.05 on 07/16/2019.  Insulin resistance. Angie Tucker has a diagnosis of insulin resistance based on her elevated fasting insulin level >5. She continues to work on diet and exercise to decrease her risk of diabetes. Angie Tucker is on no medication.  Lab Results  Component Value Date   INSULIN 12.3 09/04/2019   Lab Results  Component Value Date   HGBA1C 5.5 07/16/2019   At risk for osteoporosis. Angie Tucker is at higher risk of osteopenia and osteoporosis due to Vitamin D deficiency.   Assessment/Plan:   Vitamin D deficiency. Low Vitamin D level contributes to fatigue and are associated with obesity, breast, and colon cancer. She was given a prescription for Vitamin D, Ergocalciferol, (DRISDOL) 1.25 MG (50000 UNIT) CAPS capsule every week #4 with 0 refills and will follow-up for routine testing of Vitamin D, at least 2-3 times per year to avoid over-replacement.    Insulin resistance. [name] will continue to work on weight loss, exercise, and decreasing simple carbohydrates to help decrease the risk of diabetes. [name] agreed to follow-up with [place] as directed to closely monitor her progress. Will hold off on medication at this time and will revisit if weight loss stalls or hunger/cravings increase.  At risk for osteoporosis. [name] was given approximately 30 minutes of osteoporosis prevention counseling today. Angie Tucker is at risk for osteopenia and osteoporosis due to her Vitamin D deficiency. She was encouraged to take her Vitamin D and follow her higher calcium diet and increase strengthening exercise to help strengthen her bones and decrease her risk of osteopenia and osteoporosis.  Repetitive spaced learning was employed today to elicit superior memory formation and behavioral change.  Class 2 severe obesity with serious comorbidity and body mass index (BMI) of 35.0 to 35.9 in adult, unspecified obesity type ([place]).  Angie Tucker is currently in the action stage of change. As such, her goal is to continue with weight loss efforts. She has agreed to the Category 3 Plan.   Exercise goals: Angie Tucker will continue her current exercise regimen.  Behavioral modification strategies: increasing lean protein intake, meal planning and cooking strategies, keeping healthy foods in the home and planning for success.  Angie Tucker has agreed to follow-up with our clinic in 2 weeks. She was informed of the importance of frequent follow-up visits to maximize her success with intensive lifestyle modifications for her multiple health conditions.   Objective:   Blood pressure 122/80, pulse (!) 101, temperature 98.4 F (36.9 C),  temperature source Oral, height 5\' 2"  (1.575 m), weight 195 lb (88.5 kg), last menstrual period 08/24/2019, SpO2 97 %. Body mass index is 35.67 kg/m.  General: Cooperative, alert, well developed, in no acute distress. HEENT: Conjunctivae and lids  unremarkable. Cardiovascular: Regular rhythm.  Lungs: Normal work of breathing. Neurologic: No focal deficits.   Lab Results  Component Value Date   CREATININE 0.72 07/16/2019   BUN 8 07/16/2019   NA 135 07/16/2019   K 3.9 07/16/2019   CL 101 07/16/2019   CO2 26 07/16/2019   Lab Results  Component Value Date   ALT 22 07/16/2019   AST 22 07/16/2019   ALKPHOS 97 07/16/2019   BILITOT 0.4 07/16/2019   Lab Results  Component Value Date   HGBA1C 5.5 07/16/2019   Lab Results  Component Value Date   INSULIN 12.3 09/04/2019   Lab Results  Component Value Date   TSH 1.37 07/16/2019   Lab Results  Component Value Date   CHOL 257 (H) 07/16/2019   HDL 57.50 07/16/2019   LDLCALC 190 (H) 07/16/2019   TRIG 47.0 07/16/2019   CHOLHDL 4 07/16/2019   Lab Results  Component Value Date   WBC 8.4 07/16/2019   HGB 13.0 07/16/2019   HCT 39.4 07/16/2019   MCV 89.1 07/16/2019   PLT 310.0 07/16/2019   No results found for: IRON, TIBC, FERRITIN  Attestation Statements:   Reviewed by clinician on day of visit: allergies, medications, problem list, medical history, surgical history, family history, social history, and previous encounter notes.  I, 07/18/2019, am acting as transcriptionist for Angie Payment, MD   I have reviewed the above documentation for accuracy and completeness, and I agree with the above. - Reuben Likes, MD

## 2019-10-02 ENCOUNTER — Encounter (INDEPENDENT_AMBULATORY_CARE_PROVIDER_SITE_OTHER): Payer: Self-pay | Admitting: Adult Health

## 2019-10-02 ENCOUNTER — Other Ambulatory Visit: Payer: Self-pay

## 2019-10-02 ENCOUNTER — Ambulatory Visit (INDEPENDENT_AMBULATORY_CARE_PROVIDER_SITE_OTHER): Payer: 59 | Admitting: Adult Health

## 2019-10-02 VITALS — BP 108/71 | HR 65 | Temp 98.1°F | Ht 62.0 in | Wt 196.0 lb

## 2019-10-02 DIAGNOSIS — Z6835 Body mass index (BMI) 35.0-35.9, adult: Secondary | ICD-10-CM

## 2019-10-02 DIAGNOSIS — F419 Anxiety disorder, unspecified: Secondary | ICD-10-CM

## 2019-10-02 DIAGNOSIS — F329 Major depressive disorder, single episode, unspecified: Secondary | ICD-10-CM

## 2019-10-02 DIAGNOSIS — F32A Depression, unspecified: Secondary | ICD-10-CM

## 2019-10-02 DIAGNOSIS — E8881 Metabolic syndrome: Secondary | ICD-10-CM | POA: Diagnosis not present

## 2019-10-02 DIAGNOSIS — E559 Vitamin D deficiency, unspecified: Secondary | ICD-10-CM | POA: Diagnosis not present

## 2019-10-03 DIAGNOSIS — E559 Vitamin D deficiency, unspecified: Secondary | ICD-10-CM | POA: Insufficient documentation

## 2019-10-03 DIAGNOSIS — Z6841 Body Mass Index (BMI) 40.0 and over, adult: Secondary | ICD-10-CM | POA: Insufficient documentation

## 2019-10-03 DIAGNOSIS — E669 Obesity, unspecified: Secondary | ICD-10-CM | POA: Insufficient documentation

## 2019-10-03 DIAGNOSIS — E8881 Metabolic syndrome: Secondary | ICD-10-CM | POA: Insufficient documentation

## 2019-10-03 DIAGNOSIS — Z6835 Body mass index (BMI) 35.0-35.9, adult: Secondary | ICD-10-CM | POA: Insufficient documentation

## 2019-10-03 NOTE — Progress Notes (Signed)
Chief Complaint:   OBESITY Angie Tucker is here to discuss her progress with her obesity treatment plan along with follow-up of her obesity related diagnoses. Angie Tucker is on the Category 3 Plan and states she is following her eating plan approximately 80% of the time. Angie Tucker states she is on the elliptical and doing strength training for 60 minutes 3 times per week.  Today's visit was #: 3 Starting weight: 199 lbs Starting date: 09/04/2019 Today's weight: 196 lbs Today's date: 10/02/2019 Total lbs lost to date: 3 Total lbs lost since last in-office visit: 0  Interim History: Angie Tucker denies polyphagia on Category 3 meal plan. 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   Subjective:   1. Vitamin D deficiency Angie Tucker's Vitamin D level was 25.05 on 07/16/2019. She is currently taking prescription vitamin D 50,000 IU each week. She denies nausea, vomiting or muscle weakness.  2. Insulin resistance Angie Tucker's last insulin level was 12.3 on 09/04/2019.  3. Anxiety and depression Angie Tucker is on bupropion XL 300 mg q daily, she was previously on fluoxetine and sertraline in the past.  Assessment/Plan:   1. Vitamin D deficiency Low Vitamin D level contributes to fatigue and are associated with obesity, breast, and colon cancer. [name] agreed to continue taking prescription Vitamin D 50,000 IU every week, no refill needed today. She will follow-up for routine testing of Vitamin D, at least 2-3 times per year to avoid over-replacement.  2. Insulin resistance Angie Tucker will continue to work on weight loss, exercise, and decreasing simple carbohydrates to help decrease the risk of diabetes. We will recheck labs every 3 months. Angie Tucker agreed to follow-up with [place] as directed to closely monitor her progress.  3. Anxiety and depression Behavior modification techniques were discussed today to help Angie Tucker deal with her anxiety. Angie Tucker  will continue bupropion XL per her primary care physician. Orders and follow up as documented in patient record.   4. Class 2 severe obesity with serious comorbidity and body mass index (BMI) of 35.0 to 35.9 in adult, unspecified obesity type (HCC) [name] is currently in the action stage of change. As such, her goal is to continue with weight loss efforts. She has agreed to the Category 3 Plan.   Handouts Provided today: Protein Equivalents, and High Protein/Low Calorie Food list.  Exercise goals: As is.  Behavioral modification strategies: increasing lean protein intake and meal planning and cooking strategies.  Angie Tucker has agreed to follow-up with our clinic in 2 weeks. She was informed of the importance of frequent follow-up visits to maximize her success with intensive lifestyle modifications for her multiple health conditions.   Objective:   Blood pressure 108/71, pulse 65, temperature 98.1 F (36.7 C), temperature source Oral, height 5\' 2"  (1.575 m), weight 196 lb (88.9 kg), last menstrual period 09/30/2019, SpO2 97 %. Body mass index is 35.85 kg/m.  General: Cooperative, alert, well developed, in no acute distress. HEENT: Conjunctivae and lids unremarkable. Cardiovascular: Regular rhythm.  Lungs: Normal work of breathing. Neurologic: No focal deficits.   Lab Results  Component Value Date   CREATININE 0.72 07/16/2019   BUN 8 07/16/2019   NA 135 07/16/2019   K 3.9 07/16/2019   CL 101 07/16/2019   CO2 26 07/16/2019   Lab Results  Component Value Date   ALT 22 07/16/2019   AST 22 07/16/2019   ALKPHOS 97 07/16/2019  BILITOT 0.4 07/16/2019   Lab Results  Component Value Date   HGBA1C 5.5 07/16/2019   Lab Results  Component Value Date   INSULIN 12.3 09/04/2019   Lab Results  Component Value Date   TSH 1.37 07/16/2019   Lab Results  Component Value Date   CHOL 257 (H) 07/16/2019   HDL 57.50 07/16/2019   LDLCALC 190 (H) 07/16/2019   TRIG 47.0 07/16/2019   CHOLHDL  4 07/16/2019   Lab Results  Component Value Date   WBC 8.4 07/16/2019   HGB 13.0 07/16/2019   HCT 39.4 07/16/2019   MCV 89.1 07/16/2019   PLT 310.0 07/16/2019   No results found for: IRON, TIBC, FERRITIN  Attestation Statements:   Reviewed by clinician on day of visit: allergies, medications, problem list, medical history, surgical history, family history, social history, and previous encounter notes.  Time spent on visit including pre-visit chart review and post-visit care and charting was 29 minutes.    Trude Mcburney, am acting as Energy manager for [employer], NP-C.  I have reviewed the above documentation for accuracy and completeness, and I agree with the above. -  Julaine Fusi, NP

## 2019-10-13 ENCOUNTER — Encounter (INDEPENDENT_AMBULATORY_CARE_PROVIDER_SITE_OTHER): Payer: Self-pay | Admitting: Adult Health

## 2019-10-16 ENCOUNTER — Ambulatory Visit (INDEPENDENT_AMBULATORY_CARE_PROVIDER_SITE_OTHER): Payer: 59 | Admitting: Adult Health

## 2019-10-16 ENCOUNTER — Other Ambulatory Visit: Payer: Self-pay

## 2019-10-16 ENCOUNTER — Encounter: Payer: Self-pay | Admitting: Family

## 2019-10-16 ENCOUNTER — Ambulatory Visit: Payer: 59 | Admitting: Family

## 2019-10-16 VITALS — BP 126/90 | HR 62 | Temp 97.9°F | Ht 62.01 in | Wt 194.9 lb

## 2019-10-16 DIAGNOSIS — Z6835 Body mass index (BMI) 35.0-35.9, adult: Secondary | ICD-10-CM | POA: Diagnosis not present

## 2019-10-16 DIAGNOSIS — F419 Anxiety disorder, unspecified: Secondary | ICD-10-CM

## 2019-10-16 DIAGNOSIS — L209 Atopic dermatitis, unspecified: Secondary | ICD-10-CM | POA: Diagnosis not present

## 2019-10-16 DIAGNOSIS — F329 Major depressive disorder, single episode, unspecified: Secondary | ICD-10-CM

## 2019-10-16 DIAGNOSIS — F32A Depression, unspecified: Secondary | ICD-10-CM

## 2019-10-16 MED ORDER — DESONIDE 0.05 % EX LOTN: TOPICAL_LOTION | Freq: Two times a day (BID) | CUTANEOUS | 2 refills | Status: DC | PRN

## 2019-10-16 NOTE — Progress Notes (Signed)
Subjective:    Patient ID: JHADA RISK, female    DOB: 01-26-1981, 39 y.o.   MRN: [id number]  CC: Angie Tucker is a 39 y.o. female who presents today for follow up.   HPI: Feels well today No complaints Work going well.   Depression- Continues to find wellbutrin helpful. Takes trazodone prn which works well .   Following with [facility], pleased as  Has  lost 6 lbs thus far.   Ultrasound and mammogram scheduled.   Would like refill of desonide which she uses prn for rash; h/o atopic dermatitis   HISTORY:  Past Medical History:  Diagnosis Date  . Anxiety   . BRCA negative 03/2018   MyRisk neg  . Broken wrist Right Wrist  . Depression   . Depression   . Family history of BRCA gene positive    PGM with ovarian cancer  . Family history of breast cancer   . Family history of ovarian cancer   . Family history of pancreatic cancer   . Herpes    Past Surgical History:  Procedure Laterality Date  . FRACTURE SURGERY    . WRIST SURGERY  2011   Family History  Problem Relation Age of Onset  . Breast cancer Mother 7  . Pancreatic cancer Mother 78  . Hypertension Mother   . Cancer Mother   . Depression Mother   . Anxiety disorder Mother   . Alcohol abuse Mother   . Ovarian cancer Paternal Grandmother 39  . Uterine cancer Paternal Grandmother 56       BRCA positive  . Breast cancer Paternal Grandmother 52  . Kidney cancer Paternal Grandfather 37  . Multiple myeloma Maternal Grandmother 76  . Pancreatic cancer Paternal Great-grandmother   . Pancreatic cancer Other     Allergies: Amoxicillin Current Outpatient Medications on File Prior to Visit  Medication Sig Dispense Refill  . buPROPion (WELLBUTRIN XL) 300 MG 24 hr tablet Take 300 mg by mouth daily.    Marland Kitchen etonogestrel-ethinyl estradiol (NUVARING) 0.12-0.015 MG/24HR vaginal ring Place 1 each vaginally every 21 ( twenty-one) days. Then leave out for 1 week 3 each 3  . predniSONE (DELTASONE) 10 MG  tablet     . traZODone (DESYREL) 50 MG tablet Take 0.5-1 tablets (25-50 mg total) by mouth at bedtime as needed for sleep. 90 tablet 1  . valACYclovir (VALTREX) 1000 MG tablet Take 1 tablet (1,000 mg total) by mouth daily. 90 tablet 3  . Vitamin D, Ergocalciferol, (DRISDOL) 1.25 MG (50000 UNIT) CAPS capsule Take 1 capsule (50,000 Units total) by mouth every 7 (seven) days. 4 capsule 0   No current facility-administered medications on file prior to visit.    Social History   Tobacco Use  . Smoking status: Never Smoker  . Smokeless tobacco: Never Used  Vaping Use  . Vaping Use: Never used  Substance Use Topics  . Alcohol use: Not Currently    Alcohol/week: 0.0 standard drinks    Comment: occasionally  . Drug use: No    Review of Systems  Constitutional: Negative for chills and fever.  Respiratory: Negative for cough.   Cardiovascular: Negative for chest pain and palpitations.  Gastrointestinal: Negative for nausea and vomiting.  Skin: Positive for rash (history of).  Psychiatric/Behavioral: Negative for sleep disturbance. The patient is not nervous/anxious.       Objective:    BP 126/90 (BP Location: Left Arm, Patient Position: Sitting)   Pulse 62   Temp 97.9  F (36.6 C)   Ht 5' 2.01" (1.575 m)   Wt 194 lb 14.4 oz (88.4 kg)   LMP 09/30/2019 (Exact Date)   SpO2 98%   BMI 35.64 kg/m  BP Readings from Last 3 Encounters:  10/16/19 126/90  10/02/19 108/71  09/18/19 122/80   Wt Readings from Last 3 Encounters:  10/16/19 194 lb 14.4 oz (88.4 kg)  10/02/19 196 lb (88.9 kg)  09/18/19 195 lb (88.5 kg)    Physical Exam Vitals reviewed.  Constitutional:      Appearance: She is well-developed.  Eyes:     Conjunctiva/sclera: Conjunctivae normal.  Cardiovascular:     Rate and Rhythm: Normal rate and regular rhythm.     Pulses: Normal pulses.     Heart sounds: Normal heart sounds.  Pulmonary:     Effort: Pulmonary effort is normal.     Breath sounds: Normal breath  sounds. No wheezing, rhonchi or rales.  Skin:    General: Skin is warm and dry.  Neurological:     Mental Status: She is alert.  Psychiatric:        Speech: Speech normal.        Behavior: Behavior normal.        Thought Content: Thought content normal.        Assessment & Plan:   Problem List Items Addressed This Visit      Musculoskeletal and Integument   Atopic dermatitis   Relevant Medications   desonide (DESOWEN) 0.05 % lotion     Other   Anxiety and depression - Primary    Improved, stable. Continue wellbutrin, prn trazodone      Class 2 severe obesity with serious comorbidity and body mass index (BMI) of 35.0 to 35.9 in adult [facility])    Congratulated patient with weight loss. will continue to follow her at [facility].           I am having Angie Tucker start on desonide. I am also having her maintain her etonogestrel-ethinyl estradiol, valACYclovir, traZODone, buPROPion, Vitamin D (Ergocalciferol), and predniSONE.   Meds ordered this encounter  Medications  . desonide (DESOWEN) 0.05 % lotion    Sig: Apply topically 2 (two) times daily as needed.    Dispense:  59 mL    Refill:  2    Order Specific Question:   Supervising Provider    Answer:   Crecencio Mc [[id number]]    Return precautions given.   Risks, benefits, and alternatives of the medications and treatment plan prescribed today were discussed, and patient expressed understanding.   Education regarding symptom management and diagnosis given to patient on AVS.  Continue to follow with Burnard Hawthorne, FNP for routine health maintenance.   Angie Tucker and I agreed with plan.   [name], FNP

## 2019-10-16 NOTE — Assessment & Plan Note (Signed)
Congratulated patient with weight loss. will continue to follow her at [facility].

## 2019-10-16 NOTE — Patient Instructions (Signed)
Nice to see you!   

## 2019-10-16 NOTE — Assessment & Plan Note (Signed)
Improved, stable. Continue wellbutrin, prn trazodone

## 2019-11-11 ENCOUNTER — Other Ambulatory Visit: Payer: Self-pay

## 2019-11-11 ENCOUNTER — Other Ambulatory Visit (HOSPITAL_COMMUNITY)
Admission: RE | Admit: 2019-11-11 | Discharge: 2019-11-11 | Disposition: A | Discharge status: Home / Self Care | Payer: 59 | Source: Ambulatory Visit | Attending: Obstetrics and Gynecology | Admitting: Obstetrics and Gynecology

## 2019-11-11 ENCOUNTER — Ambulatory Visit (INDEPENDENT_AMBULATORY_CARE_PROVIDER_SITE_OTHER): Payer: 59 | Admitting: Obstetrics and Gynecology

## 2019-11-11 ENCOUNTER — Encounter: Payer: Self-pay | Admitting: Obstetrics and Gynecology

## 2019-11-11 VITALS — BP 110/70 | Ht 62.0 in | Wt 201.0 lb

## 2019-11-11 DIAGNOSIS — Z1239 Encounter for other screening for malignant neoplasm of breast: Secondary | ICD-10-CM | POA: Diagnosis not present

## 2019-11-11 DIAGNOSIS — Z124 Encounter for screening for malignant neoplasm of cervix: Secondary | ICD-10-CM

## 2019-11-11 DIAGNOSIS — Z01419 Encounter for gynecological examination (general) (routine) without abnormal findings: Secondary | ICD-10-CM

## 2019-11-11 NOTE — Progress Notes (Signed)
Gynecology Annual Exam   PCP: Burnard Hawthorne, FNP  Chief Complaint:  Chief Complaint  Patient presents with  . Gynecologic Exam    History of Present Illness: Patient is a 39 y.o. G0P0000 presents for annual exam. The patient has no complaints today.   LMP: Patient's last menstrual period was 10/30/2019 (exact date). Average Interval: regular, 28 days Duration of flow: 5 days Heavy Menses: no Clots: no Intermenstrual Bleeding: no Postcoital Bleeding: no Dysmenorrhea: no  The patient is sexually active. She currently uses nothing for for contraception. She denies dyspareunia.  The patient does perform self breast exams.  There is no notable family history of breast or ovarian cancer in her family.  The patient wears seatbelts: yes.   The patient has regular exercise: not asked.    The patient denies current symptoms of depression.    Review of Systems: ROS  Past Medical History:  Patient Active Problem List   Diagnosis Date Noted  . Atopic dermatitis 10/16/2019  . Vitamin D deficiency 10/03/2019  . 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 Class 2 severe obesity with serious comorbidity and body mass index (BMI) of 35.0 to 35.9 in adult ([place]) 10/03/2019  . Weight gain 07/16/2019  . Anxiety and depression 05/21/2019  . Herpes 05/21/2019  . Increased risk of breast cancer 05/11/2018    [place] lifetime breast cancer risk 22.7% 5 year risk 0.8% 03/28/2018 Dr Grayland Ormond- 05/2019: it would be reasonable to alternate yearly mammograms with yearly MRI.   Marland Kitchen BRCA negative 05/11/2018    MyRisk negative 03/28/2018     Past Surgical History:  Past Surgical History:  Procedure Laterality Date  . FRACTURE SURGERY    . WRIST SURGERY  2011    Gynecologic History:  Patient's last menstrual period was 10/30/2019 (exact date). Contraception: none Last Pap: Results were: 03/28/2018 NILM  06/29/2016 Negative colposcopy 04/19/2016 LGSIL  Obstetric History: G0P0000  Family  History:  Family History  Problem Relation Age of Onset  . Breast cancer Mother 89  . Pancreatic cancer Mother 96  . Hypertension Mother   . Cancer Mother   . Depression Mother   . Anxiety disorder Mother   . Alcohol abuse Mother   . Ovarian cancer Paternal Grandmother 45  . Uterine cancer Paternal Grandmother 32       BRCA positive  . Breast cancer Paternal Grandmother 47  . Kidney cancer Paternal Grandfather 46  . Multiple myeloma Maternal Grandmother 71  . Pancreatic cancer Paternal Great-grandmother   . Pancreatic cancer Other     Social History:  Social History   Socioeconomic History  . Marital status: Divorced    Spouse name: Not on file  . Number of children: Not on file  . Years of education: Not on file  . Highest education level: Not on file  Occupational History  . Not on file  Tobacco Use  . Smoking status: Never Smoker  . Smokeless tobacco: Never Used  Vaping Use  . Vaping Use: Never used  Substance and Sexual Activity  . Alcohol use: Not Currently    Alcohol/week: 0.0 standard drinks    Comment: occasionally  . Drug use: No  . Sexual activity: Yes    Birth control/protection: None  Other Topics Concern  . Not on file  Social History Narrative   NP in ICU [place]   Has twin fraternal sister, and brother.    Social Determinants of Health   Financial Resource Strain:   .  Difficulty of Paying Living Expenses:   Food Insecurity:   . Worried About Charity fundraiser in the Last Year:   . Arboriculturist in the Last Year:   Transportation Needs:   . Film/video editor (Medical):   Marland Kitchen Lack of Transportation (Non-Medical):   Physical Activity:   . Days of Exercise per Week:   . Minutes of Exercise per Session:   Stress:   . Feeling of Stress :   Social Connections:   . Frequency of Communication with Friends and Family:   . Frequency of Social Gatherings with Friends and Family:   . Attends Religious Services:   . Active  Member of Clubs or Organizations:   . Attends Archivist Meetings:   Marland Kitchen Marital Status:   Intimate Partner Violence:   . Fear of Current or Ex-Partner:   . Emotionally Abused:   Marland Kitchen Physically Abused:   . Sexually Abused:     Allergies:  Allergies  Allergen Reactions  . Amoxicillin Rash    Medications: Prior to Admission medications   Medication Sig Start Date End Date Taking? Authorizing Provider  buPROPion (WELLBUTRIN XL) 300 MG 24 hr tablet Take 300 mg by mouth daily.   Yes [provider]  desonide (DESOWEN) 0.05 % lotion Apply topically 2 (two) times daily as needed. 10/16/19  Yes Burnard Hawthorne, FNP  traZODone (DESYREL) 50 MG tablet Take 0.5-1 tablets (25-50 mg total) by mouth at bedtime as needed for sleep. 05/21/19  Yes Burnard Hawthorne, FNP  valACYclovir (VALTREX) 1000 MG tablet Take 1 tablet (1,000 mg total) by mouth daily. 05/21/19  Yes Arnett, Yvetta Coder, FNP  Vitamin D, Ergocalciferol, (DRISDOL) 1.25 MG (50000 UNIT) CAPS capsule Take 1 capsule (50,000 Units total) by mouth every 7 (seven) days. Patient not taking: Reported on 11/11/2019 09/18/19   Eber Jones, MD    Physical Exam Vitals: Blood pressure 110/70, height '5\' 2"'  (1.575 m), weight 201 lb (91.2 kg), last menstrual period 10/30/2019.  General: NAD HEENT: normocephalic, anicteric Thyroid: no enlargement, no palpable nodules Pulmonary: No increased work of breathing, CTAB Cardiovascular: RRR, distal pulses 2+ Breast: Breast symmetrical, no tenderness, no palpable nodules or masses, no skin or nipple retraction present, no nipple discharge.  No axillary or supraclavicular lymphadenopathy. Abdomen: NABS, soft, non-tender, non-distended.  Umbilicus without lesions.  No hepatomegaly, splenomegaly or masses palpable. No evidence of hernia  Genitourinary:  External: Normal external female genitalia.  Normal urethral meatus, normal Bartholin's and Skene's glands.    Vagina: Normal vaginal  mucosa, no evidence of prolapse.    Cervix: Grossly normal in appearance, no bleeding  Uterus: Non-enlarged, mobile, normal contour.  No CMT  Adnexa: ovaries non-enlarged, no adnexal masses  Rectal: deferred  Lymphatic: no evidence of inguinal lymphadenopathy Extremities: no edema, erythema, or tenderness Neurologic: Grossly intact Psychiatric: mood appropriate, affect full  Female chaperone present for pelvic and breast  portions of the physical exam    Assessment: 39 y.o. G0P0000 routine annual exam  Plan: Problem List Items Addressed This Visit    None    Visit Diagnoses    Encounter for gynecological examination without abnormal finding    -  Primary   Screening for malignant neoplasm of cervix       Relevant Orders   Cytology - PAP   Breast screening          2) STI screening  was notoffered and therefore not obtained  2)  ASCCP guidelines  and rational discussed.  Patient opts for every 3 years screening interval  3) Contraception - the patient is currently using  none.  She is attempting to conceive in the near future  4) Routine healthcare maintenance including cholesterol, diabetes screening discussed managed by PCP   5) Fully  COVID vaccinated at work   6) Return in about 1 year (around 11/10/2020) for [place], MD, [place] 11/11/2019, 8:33 AM

## 2019-11-12 ENCOUNTER — Encounter: Payer: Self-pay | Admitting: Family

## 2019-11-13 ENCOUNTER — Other Ambulatory Visit: Payer: Self-pay | Admitting: Family

## 2019-11-13 DIAGNOSIS — L209 Atopic dermatitis, unspecified: Secondary | ICD-10-CM

## 2019-11-13 MED ORDER — TRIAMCINOLONE ACETONIDE 0.025 % EX CREA: 1.0000 "application " | TOPICAL_CREAM | Freq: Two times a day (BID) | CUTANEOUS | 1 refills | Status: DC

## 2019-11-14 LAB — CYTOLOGY - PAP
Comment: NEGATIVE
Diagnosis: NEGATIVE
High risk HPV: NEGATIVE

## 2019-12-02 ENCOUNTER — Other Ambulatory Visit: Payer: Self-pay

## 2019-12-02 ENCOUNTER — Other Ambulatory Visit: Payer: Self-pay | Admitting: Obstetrics and Gynecology

## 2019-12-02 ENCOUNTER — Ambulatory Visit
Admission: RE | Admit: 2019-12-02 | Discharge: 2019-12-02 | Disposition: A | Discharge status: Home / Self Care | Payer: 59 | Source: Ambulatory Visit | Attending: Obstetrics and Gynecology | Admitting: Obstetrics and Gynecology

## 2019-12-02 DIAGNOSIS — N6310 Unspecified lump in the right breast, unspecified quadrant: Secondary | ICD-10-CM

## 2019-12-02 DIAGNOSIS — R922 Inconclusive mammogram: Secondary | ICD-10-CM | POA: Diagnosis not present

## 2019-12-02 DIAGNOSIS — N6311 Unspecified lump in the right breast, upper outer quadrant: Secondary | ICD-10-CM | POA: Diagnosis not present

## 2019-12-26 ENCOUNTER — Other Ambulatory Visit: Payer: Self-pay | Admitting: Obstetrics and Gynecology

## 2019-12-26 DIAGNOSIS — R928 Other abnormal and inconclusive findings on diagnostic imaging of breast: Secondary | ICD-10-CM

## 2020-01-27 DIAGNOSIS — Z23 Encounter for immunization: Secondary | ICD-10-CM | POA: Diagnosis not present

## 2020-03-04 ENCOUNTER — Other Ambulatory Visit: Payer: Self-pay | Admitting: Obstetrics and Gynecology

## 2020-03-04 DIAGNOSIS — Z3401 Encounter for supervision of normal first pregnancy, first trimester: Secondary | ICD-10-CM

## 2020-03-05 ENCOUNTER — Telehealth: Payer: Self-pay | Admitting: Obstetrics and Gynecology

## 2020-03-05 NOTE — Telephone Encounter (Signed)
Patient is scheduled for 03/18/20 at 11:30 for dating/viability u/s. Marland Kitchenws

## 2020-03-05 NOTE — Telephone Encounter (Signed)
-----   Message from Conard Novak, MD sent at 03/04/2020  5:39 PM EST ----- Regarding: Schedule u/s with NOB appt Are you able to schedule a dating/viability u/s for her prior to her NOB appt? I put the order in. thanks

## 2020-03-11 ENCOUNTER — Ambulatory Visit: Payer: 59 | Admitting: Family

## 2020-03-18 ENCOUNTER — Encounter: Payer: Self-pay | Admitting: Obstetrics and Gynecology

## 2020-03-18 ENCOUNTER — Ambulatory Visit (INDEPENDENT_AMBULATORY_CARE_PROVIDER_SITE_OTHER): Payer: 59

## 2020-03-18 ENCOUNTER — Other Ambulatory Visit: Payer: Self-pay | Admitting: Obstetrics and Gynecology

## 2020-03-18 ENCOUNTER — Ambulatory Visit (INDEPENDENT_AMBULATORY_CARE_PROVIDER_SITE_OTHER): Payer: 59 | Admitting: Obstetrics and Gynecology

## 2020-03-18 ENCOUNTER — Other Ambulatory Visit: Payer: Self-pay

## 2020-03-18 VITALS — BP 158/85 | Wt 200.6 lb

## 2020-03-18 DIAGNOSIS — Z3401 Encounter for supervision of normal first pregnancy, first trimester: Secondary | ICD-10-CM

## 2020-03-18 DIAGNOSIS — Z3A01 Less than 8 weeks gestation of pregnancy: Secondary | ICD-10-CM | POA: Diagnosis not present

## 2020-03-18 DIAGNOSIS — N926 Irregular menstruation, unspecified: Secondary | ICD-10-CM

## 2020-03-18 DIAGNOSIS — O200 Threatened abortion: Secondary | ICD-10-CM | POA: Diagnosis not present

## 2020-03-18 LAB — POCT URINE PREGNANCY: Preg Test, Ur: POSITIVE — AB

## 2020-03-18 NOTE — Progress Notes (Signed)
Obstetrics & Gynecology Office Visit   Chief Complaint:  Chief Complaint  Patient presents with  . Follow-up    Ultrasound for dating/viability of new pregnancy    History of Present Illness: 39 y.o. G1P0000 female who initially presented to establish obstetrical care today. She underwent an ultrasound this morning that showed a di/di twin gestation, which was concerning for no cardiac activity in either embryo.  While the [place] of one embryo met criteria for failed pregnancy (8.1 mm), the other did not (5.5 mm).  The mean sac diameter was less than 2.5 cm for both sacs, as well.  She has had no bleeding nor cramping. Based on her LMP her gestational age should be [redacted]w[redacted]d However, she states that she checked for ovulation and did not have a positive test when she thought she should have one. She is understandably upset by the findings of her ultrasound today.   Past Medical History:  Diagnosis Date  . Anxiety   . BRCA negative 03/2018   MyRisk neg  . Broken wrist Right Wrist  . Depression   . Depression   . Family history of BRCA gene positive    PGM with ovarian cancer  . Family history of breast cancer   . Family history of ovarian cancer   . Family history of pancreatic cancer   . Herpes     Past Surgical History:  Procedure Laterality Date  . FRACTURE SURGERY    . WRIST SURGERY  2011    Gynecologic History: Patient's last menstrual period was 01/20/2020.  Obstetric History: G1P0000  Family History  Problem Relation Age of Onset  . Breast cancer Mother 331 . Pancreatic cancer Mother 578 . Hypertension Mother   . Cancer Mother   . Depression Mother   . Anxiety disorder Mother   . Alcohol abuse Mother   . Ovarian cancer Paternal Grandmother 745 . Uterine cancer Paternal Grandmother 736      BRCA positive  . Breast cancer Paternal Grandmother 669 . Kidney cancer Paternal Grandfather 717 . Multiple myeloma Maternal Grandmother 734 . Pancreatic cancer Paternal  Great-grandmother   . Pancreatic cancer Other     Social History   Socioeconomic History  . Marital status: Divorced    Spouse name: Not on file  . Number of children: Not on file  . Years of education: Not on file  . Highest education level: Not on file  Occupational History  . Not on file  Tobacco Use  . Smoking status: Never Smoker  . Smokeless tobacco: Never Used  Vaping Use  . Vaping Use: Never used  Substance and Sexual Activity  . Alcohol use: Not Currently    Alcohol/week: 0.0 standard drinks    Comment: occasionally  . Drug use: No  . Sexual activity: Yes    Birth control/protection: None  Other Topics Concern  . 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    Social Determinants of Health   Financial Resource Strain:   . Difficulty of Paying Living Expenses: Not on file  Food Insecurity:   . Worried About RCharity fundraiserin the Last Year: Not on file  . Ran Out of Food in the Last Year: Not on file  Transportation Needs:   . Lack of Transportation (Medical): Not on file  . Lack of Transportation (Non-Medical): Not on file  Physical  Activity:   . Days of Exercise per Week: Not on file  . Minutes of Exercise per Session: Not on file  Stress:   . Feeling of Stress : Not on file  Social Connections:   . Frequency of Communication with Friends and Family: Not on file  . Frequency of Social Gatherings with Friends and Family: Not on file  . Attends Religious Services: Not on file  . Active Member of Clubs or Organizations: Not on file  . Attends Archivist Meetings: Not on file  . Marital Status: Not on file  Intimate Partner Violence:   . Fear of Current or Ex-Partner: Not on file  . Emotionally Abused: Not on file  . Physically Abused: Not on file  . Sexually Abused: Not on file    Allergies  Allergen Reactions  . Amoxicillin Rash    Prior to Admission  medications   Medication Sig Start Date End Date Taking? Authorizing Provider  buPROPion (WELLBUTRIN XL) 300 MG 24 hr tablet Take 300 mg by mouth daily.    [provider]  desonide (DESOWEN) 0.05 % lotion Apply topically 2 (two) times daily as needed. 10/16/19   Burnard Hawthorne, FNP  traZODone (DESYREL) 50 MG tablet Take 0.5-1 tablets (25-50 mg total) by mouth at bedtime as needed for sleep. 05/21/19   Burnard Hawthorne, FNP  triamcinolone (KENALOG) 0.025 % cream Apply 1 application topically 2 (two) times daily. 11/13/19   Burnard Hawthorne, FNP  valACYclovir (VALTREX) 1000 MG tablet Take 1 tablet (1,000 mg total) by mouth daily. 05/21/19   Burnard Hawthorne, FNP    Review of Systems  Constitutional: Negative.   HENT: Negative.   Eyes: Negative.   Respiratory: Negative.   Cardiovascular: Negative.   Gastrointestinal: Negative.   Genitourinary: Negative.   Musculoskeletal: Negative.   Skin: Negative.   Neurological: Negative.   Psychiatric/Behavioral: Negative.      Physical Exam BP (!) 158/85   Wt 200 lb 9.6 oz (91 kg)   LMP 01/20/2020   BMI 36.69 kg/m  Patient's last menstrual period was 01/20/2020. Physical Exam Constitutional:      General: She is not in acute distress.    Appearance: Normal appearance.  HENT:     Head: Normocephalic and atraumatic.  Eyes:     General: No scleral icterus.    Conjunctiva/sclera: Conjunctivae normal.  Neurological:     General: No focal deficit present.     Mental Status: She is alert and oriented to person, place, and time.     Cranial Nerves: No cranial nerve deficit.  Psychiatric:        Mood and Affect: Mood normal.        Behavior: Behavior normal.        Judgment: Judgment normal.    I attempted a bedside transvaginal ultrasound today. I got measurements similar to and slightly smaller than the sonographer of the embryos.    Female chaperone present for pelvic and breast  portions of the physical exam  Imaging  Results US OB Transvaginal  Result Date: 03/18/2020 Patient Name: Angie Tucker DOB: 1981/03/20 MRN: [id number] ULTRASOUND REPORT Location: [facility] Date of Service: 03/18/2020 Indications:dating Findings: Di/Di twin pregnancy seen. The CRL of twin A measures 5.5 mm which correlated with a 61w2dpregnancy, EDD: 11/09/2020. The CRL of twin B measures 8.1 mm which correlates with a 610w5dregnancy, EDD: 11/06/2020. The (U/S) EDD is not consistent with the clinically established EDD of 10/26/2020, 8w42w2d  No fetal heartbeat is seen for either twin today. Yolk sac is visualized and appears normal for twin B. Twin A's yolk sac is not visualized today. Amnion: visualized and appears normal for both twins. Right Ovary is normal in appearance. Left Ovary is normal appearance. Corpus luteal cyst:  One corpus luteum in both ovaries. Survey of the adnexa demonstrates no adnexal masses. There is no free peritoneal fluid in the cul de sac. Impression: 1. 27w2dand 641w5di/Di twin pregnancy. 2. No heartbeat is seen for either twin today. 3. (U/S) EDD is not consistent with Clinically established EDD of 10/26/2020. Recommendations: 1.Clinical correlation with the patient's History and Physical Exam. 2. One of the twins does meet criteria for early pregnancy loss. The other does not.  Consider repeat ultrasound in 1 week.* Gweneth DimitriRT The ultrasound images and findings were reviewed by me and I agree with the above report. StPrentice DockerMD, FALoura PardonB/GYN, [facility] 03/18/2020 1:45 PM   *Criteria from the Society of Radiologists [place] Early First Trimester Diagnosis of Miscarriage and Exclusion of a Viable Intrauterine Pregnancy, October 2012.    Assessment: [age].o. G1G39P0000emale here for  1. Threatened abortion   2. Missed period      Plan: Problem List Items Addressed This Visit    None    Visit Diagnoses    Threatened abortion     -  Primary   Relevant Orders   US[place]B Comp Less 14 Wks   Missed period       Relevant Orders   POCT urine pregnancy (Completed)     We discussed the ultrasound findings from today. While they are certainly concerning for early pregnancy loss for her twins, it is not entirely clear that she meets criteria for early pregnancy loss. One twin did meet CRL criteria. However, given this very desired pregnancy and the borderline measurements of the embryos and gestational sacs (which were actually too small to call for certain), I recommended repeat ultrasound to verify the status of this pregnancy. We discussed that one of three outcomes could occur; normal progression of a di/di twin gestation, loss of one embryo and normal pregnancy of one surviving embryo, and loss of both embryos.  We briefly touched on possible management options should this be a complete pregnancy loss of both embryos.  All questions answered.   A total of 20 minutes were spent face-to-face with the patient as well as preparation, review, communication, and documentation during this encounter.    StPrentice DockerMD 03/18/2020 1:44 PM

## 2020-03-23 ENCOUNTER — Telehealth: Payer: Self-pay

## 2020-03-23 NOTE — Telephone Encounter (Signed)
Pt coming in earlier for u/s this week

## 2020-03-26 ENCOUNTER — Encounter: Payer: Self-pay | Admitting: Obstetrics and Gynecology

## 2020-03-26 ENCOUNTER — Ambulatory Visit (INDEPENDENT_AMBULATORY_CARE_PROVIDER_SITE_OTHER): Payer: 59

## 2020-03-26 ENCOUNTER — Other Ambulatory Visit: Payer: Self-pay | Admitting: Obstetrics and Gynecology

## 2020-03-26 ENCOUNTER — Ambulatory Visit (INDEPENDENT_AMBULATORY_CARE_PROVIDER_SITE_OTHER): Payer: 59 | Admitting: Obstetrics and Gynecology

## 2020-03-26 ENCOUNTER — Other Ambulatory Visit: Payer: Self-pay

## 2020-03-26 VITALS — BP 130/80 | Wt 206.0 lb

## 2020-03-26 DIAGNOSIS — O200 Threatened abortion: Secondary | ICD-10-CM

## 2020-03-26 DIAGNOSIS — O021 Missed abortion: Secondary | ICD-10-CM | POA: Diagnosis not present

## 2020-03-26 DIAGNOSIS — Z3A01 Less than 8 weeks gestation of pregnancy: Secondary | ICD-10-CM | POA: Diagnosis not present

## 2020-03-26 DIAGNOSIS — O039 Complete or unspecified spontaneous abortion without complication: Secondary | ICD-10-CM | POA: Diagnosis not present

## 2020-03-26 MED ORDER — TRAMADOL HCL 50 MG PO TABS: 50.0000 mg | ORAL_TABLET | Freq: Four times a day (QID) | ORAL | 0 refills | Status: DC | PRN

## 2020-03-26 MED ORDER — MISOPROSTOL 200 MCG PO TABS: ORAL_TABLET | ORAL | 0 refills | Status: DC

## 2020-03-26 NOTE — Progress Notes (Signed)
Obstetrics & Gynecology Office Visit   Chief Complaint: Follow-up for viability [place]     Chief Complaint  Patient presents with  . Follow-up    Ultrasound for dating/viability    History of Present Illness: 39 y.o. G60P0000 female who presented for viability [place] follow-up. She underwent an ultrasound today that showed a nonviable Di/Di twin pregnancy. On 03/18/20 the patient underwent US that was concerning regarding no cardiac activity in either embryo. At that time twin A CRL measured 5.5 mm and there was no yolk sac present and twin B CRL measured 8.1 mm without cardiac activity with a yolk sac present. Today on ultrasound twin A CRL measured 7.2 mm without cardiac activity and twin B CRL measured 2.0 mm without cardiac activity. Yolk sac and amnion were not visualized for either embryo. [place] results reviewed with patient. Today she reports light vaginal spotting. Denies additional S&S of pregnancy.       Past Medical History:  Diagnosis Date  . Anxiety   . BRCA negative 03/2018   MyRisk neg  . Broken wrist Right Wrist  . Depression   . Depression   . Family history of BRCA gene positive    PGM with ovarian cancer  . Family history of breast cancer   . Family history of ovarian cancer   . Family history of pancreatic cancer   . Herpes          Past Surgical History:  Procedure Laterality Date  . FRACTURE SURGERY    . WRIST SURGERY  2011    Gynecologic History: Patient's last menstrual period was 01/20/2020.  Obstetric History: G1P0000       Family History  Problem Relation Age of Onset  . Breast cancer Mother 45  . Pancreatic cancer Mother 56  . Hypertension Mother   . Cancer Mother   . Depression Mother   . Anxiety disorder Mother   . Alcohol abuse Mother   . Ovarian cancer Paternal Grandmother 14  . Uterine cancer Paternal Grandmother 23       BRCA positive  . Breast cancer Paternal Grandmother 63  . Kidney cancer Paternal  Grandfather 65  . Multiple myeloma Maternal Grandmother 43  . Pancreatic cancer Paternal Great-grandmother   . Pancreatic cancer Other     Social History        Socioeconomic History  . Marital status: Divorced    Spouse name: Not on file  . Number of children: Not on file  . Years of education: Not on file  . Highest education level: Not on file  Occupational History  . Not on file  Tobacco Use  . Smoking status: Never Smoker  . Smokeless tobacco: Never Used  Vaping Use  . Vaping Use: Never used  Substance and Sexual Activity  . Alcohol use: Not Currently    Alcohol/week: 0.0 standard drinks    Comment: occasionally  . Drug use: No  . Sexual activity: Yes    Birth control/protection: None  Other Topics Concern  . 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    Social Determinants of Health      Financial Resource Strain:   . Difficulty of Paying Living Expenses: Not on file  Food Insecurity:   . Worried About Charity fundraiser in the Last Year: Not on file  . Ran Out of Food in the Last Year: Not on file  Transportation Needs:   . Film/video editor (Medical): Not on file  . Lack of Transportation (Non-Medical): Not on file  Physical Activity:   . Days of Exercise per Week: Not on file  . Minutes of Exercise per Session: Not on file  Stress:   . Feeling of Stress : Not on file  Social Connections:   . Frequency of Communication with Friends and Family: Not on file  . Frequency of Social Gatherings with Friends and Family: Not on file  . Attends Religious Services: Not on file  . Active Member of Clubs or Organizations: Not on file  . Attends Archivist Meetings: Not on file  . Marital Status: Not on file  Intimate Partner Violence:   . Fear of Current or Ex-Partner: Not on file  . Emotionally Abused: Not on file  . Physically Abused: Not on file  .  Sexually Abused: Not on file        Allergies  Allergen Reactions  . Amoxicillin Rash           Prior to Admission medications   Medication Sig Start Date End Date Taking? Authorizing Provider  buPROPion (WELLBUTRIN XL) 300 MG 24 hr tablet Take 300 mg by mouth daily.    [provider]  desonide (DESOWEN) 0.05 % lotion Apply topically 2 (two) times daily as needed. 10/16/19   Burnard Hawthorne, FNP  traZODone (DESYREL) 50 MG tablet Take 0.5-1 tablets (25-50 mg total) by mouth at bedtime as needed for sleep. 05/21/19   Burnard Hawthorne, FNP  triamcinolone (KENALOG) 0.025 % cream Apply 1 application topically 2 (two) times daily. 11/13/19   Burnard Hawthorne, FNP  valACYclovir (VALTREX) 1000 MG tablet Take 1 tablet (1,000 mg total) by mouth daily. 05/21/19   Burnard Hawthorne, FNP    Review of Systems  Constitutional: Negative.   HENT: Negative.   Eyes: Negative.   Respiratory: Negative.   Cardiovascular: Negative.   Gastrointestinal: Negative.   Genitourinary: Negative.   Musculoskeletal: Negative.   Skin: Negative.   Neurological: Negative.   Psychiatric/Behavioral: Negative.      Physical Exam BP (!) 130/80   Wt 206 lb (93 kg)   LMP 01/20/2020   BMI 37.68 kg/m  Patient's last menstrual period was 01/20/2020. Physical Exam Constitutional:      General: She is not in acute distress.    Appearance: Normal appearance.  HENT:     Head: Normocephalic and atraumatic.  Eyes:     General: No scleral icterus.    Conjunctiva/sclera: Conjunctivae normal.  Neurological:     General: No focal deficit present.     Mental Status: She is alert and oriented to person, place, and time.     Cranial Nerves: No cranial nerve deficit.  Psychiatric:        Mood and Affect: Mood normal.        Behavior: Behavior normal.        Judgment: Judgment normal.    Imaging Results US OB Transvaginal  Findings:  Di/Di twin pregnancy is visualized with a CRL  consistent with 55w4dgestation, giving an (U/S) EDD of 11/15/2020 for twin A and 524w5destation, giving an EDD of 11/21/2020 for twin B. . The (U/S) EDD is not consistent with the clinically established EDD of 10/26/2020. There is not fetal heartbeat seen for either Twin.  CRL measurement: 7.2 mm Twin A CRL measurement: 2.0 mm Twin B Yolk sac and amnion are not visualized for either  Twin. Right Ovary is normal in appearance. Left Ovary is normal appearance. Corpus luteal cyst:  One is seen in each ovary Survey of the adnexa demonstrates no adnexal masses. There is no free peritoneal fluid in the cul de sac.  Impression: 1. 67w4dNonViable Di/Di twin pregnancy.  2. (U/S) EDD is not consistent with Clinically established EDD of 10/26/2020.  Recommendations: 1.Clinical correlation with the patient's History and Physical Exam. EGweneth Dimitri RT  Findings consistent with pregnancy failure.  Today's [place]ets the following criteria:  1. Crown-rump length (CRL) of ?7 mm and no heartbeat on a transvaginal scan. 2. Pregnancy with an embryo with no heart activity on initial scan and repeat scan ?7 days later.  I have reviewed this ultrasound and the report. I agree with the above assessment and plan.  [facility] 03/26/20  Assessment: 39y.o. G1P0000 female here for viability UKoreafollow-up 1. Abortion, Missed  2. Missed period      Plan: -Reviewed UKoreafindings with patient - consistent with early pregnancy loss -Condolences were offered to the patient and her significant other.  I stressed that while emotionally difficult, that this did not occur because of an actions or inactions by the patient.  Somewhere between 10-20% of identified first trimester pregnancies will unfortunately end in miscarriage.   -We briefly discussed management options including expectant management, medical management, and surgical management as well as their  relative success rates and complications. Approximately 80% of first trimester miscarriages will pass successfully but may require a time frame of up to 8 weeks ([place] May 2015 "Early Pregnancy Loss").  Medical management using 8025m of misoprostil administered every 3-hrs as needed for up to 3 doses speeds up the time frame to completion significantly, has literature supporting its use up to 63 days or 9w81w0dstation and results in a passage rate of 84-85% (ACOG Practice Bulletin 143 March 2014 "Medical Management of First-Trimester Abortion").  Dilation and curettage has the highest rate of uterine evacuation, but carries with is operative cost, surgical and anesthetic risk.  While these risk are relatively small they nevertheless include infection, bleeding, uterine perforation, formation of uterine synechia, and in rare cases death. -Patient undecided regarding management approach today, but requested Rx filled for medical management incase she decides to proceed with that approach - education provided regarding medical management, given via written instructions in AVS -Type and screen today as no ABO on file and patient unaware of her blood type, will f/u if Rh - status -Recommend f/u with [place] 2-4 weeks after medication administration -Patient encouraged to reach out via phone or myChart with any additional questions or concerns -US Koreandings and plan reviewed with Dr. SchGilman SchmidtA total of 30 minutes were spent face-to-face with the patient as well as preparation, review, communication, and documentation during this encounter.     KatOrlie PollenNM, MSN 03/26/20   09:32 pm

## 2020-03-26 NOTE — Patient Instructions (Addendum)
Medical Miscarriage Management - Take Home Instructions  How to Prepare . Insert 4 misoprostol tablets as directed by your doctor. o Buccal: Rinse your mouth out with water. Place 2 misoprostol tablets between your gums and cheek on each side (4 tablets total). Let the tablets dissolve. After 1 hour, swallow the tablet parts that are left with a sip of water. Do not eat, drink, or chew gum while the tablets are dissolving. OR o Vaginal: Moisten the 4 tablets with a little bit of water and use your finger to insert them as high up into the vagina as possible. Sit or lie down for 30 minutes after placing the tablets so they don't fall out.  . Because the medication will cause you to have bleeding and cramping (and possibly nausea or diarrhea), arrange for the following: o Time off from work o Child care for the day o A support person o Heating pad or hot water bottle o Fluids and light foods (water, ginger ale, broth, jell-o, crackers, etc.) o Menstrual/ period pads  What to Expect  Bleeding Most women will have bleeding that starts within 1-4 hours of taking the misoprostol. You should expect bleeding that is heavier than your normal period. You may pass blood clots (some clots may even be the size of lemons or oranges). The bleeding should be heaviest during the first several hours and should then decrease. There is a very small risk of excessive bleeding, which may require you to have a surgical procedure (D&C) or receive other medical treatment. If you do not have any bleeding within 12 hours of taking the misoprostol, please call us.  Cramping It is normal to have cramping. More severe lower abdominal cramping may last for several hours when the pregnancy tissue is passing out of the body. After the pregnancy passes, the cramping should decrease. Take pain medications as prescribed and use a heating pad or hot water bottle as needed.  Pregnancy tissue You may pass the  pregnancy tissue at an unexpected time or place. Most women will not see an embryo pass (a 7 week embryo is smaller than a grain of rice). You may see a blood clot or a small white clump of tissue.  Other side effects Fatigue, nausea, stomach upset, and diarrhea are common. These symptoms should go away within a few days.  Menstrual cycle (periods) After the initial bleeding, you may continue to have light bleeding or spotting for several weeks. Your first period may be lighter or heavier than usual. Your periods may be irregular for the first few months.  Contraception (Water quality scientist) Most women are able to become pregnant again very quickly after a miscarriage, usually before they have had their next menstrual period. If desired, you can start your birth control method the day after you take the pills (or as directed by your doctor).  Exercise & Rest Most women prefer to take it easy for a few days after a miscarriage. You should not exercise strenuously for the first week (running, tough work-outs, etc.), as cramping or bleeding may happen. Let your body be your guide.  Feelings Women experience a variety of emotions when they are pregnant and when they have a miscarriage. Some of these feelings may be stronger because of the hormonal changes. If you are feeling consistently sad or distressed, please contact us.  Things to Avoid . Avoid aspirin and alcohol until your follow-up appointment, because these may increase you're bleeding and worsen stomach upset. . Do not  use tampons for 2 weeks and until seen for follow-up (use pads instead). . Don't have sex for at least 2 weeks and until seen for follow-up. . Don't soak in a tub bath or spa, or go swimming for 2 weeks (showers ok). Follow-Up You must be seen for a follow-up appointment and ultrasound in 2 weeks (or as directed by your doctor). This is the only way to confirm that the pregnancy has passed completely. Contact  us . Contact us right away if you have: o Bleeding that is more than 2 completely soaked pads per hour for more than 2 hours in a row. o Persistent feelings that you might pass out or faint. o Pain that is uncontrolled with medications, rest, and heating pads. o A fever higher than 100.75F. o Severe vomiting or diarrhea lasting more than 4-6 hours. o Symptoms of an allergic reaction (rash, shortness of breath). . If you are having an emergency, please go to an emergency room.

## 2020-03-27 LAB — ABO AND RH: Rh Factor: POSITIVE

## 2020-03-30 ENCOUNTER — Ambulatory Visit: Payer: 59

## 2020-03-30 ENCOUNTER — Encounter: Payer: 59 | Admitting: Obstetrics and Gynecology

## 2020-03-31 DIAGNOSIS — F4329 Adjustment disorder with other symptoms: Secondary | ICD-10-CM | POA: Diagnosis not present

## 2020-04-06 ENCOUNTER — Ambulatory Visit: Payer: 59 | Admitting: Obstetrics and Gynecology

## 2020-04-06 ENCOUNTER — Other Ambulatory Visit: Payer: 59

## 2020-04-06 DIAGNOSIS — F4329 Adjustment disorder with other symptoms: Secondary | ICD-10-CM | POA: Diagnosis not present

## 2020-04-13 ENCOUNTER — Ambulatory Visit (INDEPENDENT_AMBULATORY_CARE_PROVIDER_SITE_OTHER): Payer: 59 | Admitting: Obstetrics and Gynecology

## 2020-04-13 ENCOUNTER — Encounter: Payer: Self-pay | Admitting: Obstetrics and Gynecology

## 2020-04-13 ENCOUNTER — Ambulatory Visit (INDEPENDENT_AMBULATORY_CARE_PROVIDER_SITE_OTHER): Payer: 59

## 2020-04-13 ENCOUNTER — Other Ambulatory Visit: Payer: Self-pay

## 2020-04-13 VITALS — BP 138/92 | Ht 62.0 in | Wt 202.0 lb

## 2020-04-13 DIAGNOSIS — Z8759 Personal history of other complications of pregnancy, childbirth and the puerperium: Secondary | ICD-10-CM | POA: Diagnosis not present

## 2020-04-13 DIAGNOSIS — F4329 Adjustment disorder with other symptoms: Secondary | ICD-10-CM | POA: Diagnosis not present

## 2020-04-13 DIAGNOSIS — O021 Missed abortion: Secondary | ICD-10-CM | POA: Diagnosis not present

## 2020-04-13 DIAGNOSIS — Z3169 Encounter for other general counseling and advice on procreation: Secondary | ICD-10-CM

## 2020-04-13 NOTE — Progress Notes (Signed)
Patient ID: Angie Tucker, female   DOB: 06-18-1980, 39 y.o.   MRN: [id number]  Reason for Consult: Follow-up (TVUS - no concerns. RM 4)   Referred by Burnard Hawthorne, FNP  Subjective:     HPI:  Angie Tucker is a 39 y.o. female who presents following cytotec administration following diagnosis of an MAB on 03/26/20. Patient reports taking one does of the medication with appropriate results. Denies current vaginal bleeding. Has noted spotting one time in last two weeks. Patient reports she is working with a Social worker which has been helpful in processing loss. Denies further concern today.  Gynecological History LMP: 01/20/20 Describes periods as regular Last pap smear: 11/11/19 Last Mammogram: n/a History of STDs: n/a Sexually Active: yes  Obstetrical History G1P0010 - s/p medical management of MAB  Past Medical History:  Diagnosis Date  . Anxiety   . BRCA negative 03/2018   MyRisk neg  . Broken wrist Right Wrist  . Depression   . Depression   . Family history of BRCA gene positive    PGM with ovarian cancer  . Family history of breast cancer   . Family history of ovarian cancer   . Family history of pancreatic cancer   . Herpes    Family History  Problem Relation Age of Onset  . Breast cancer Mother 56  . Pancreatic cancer Mother 32  . Hypertension Mother   . Cancer Mother   . Depression Mother   . Anxiety disorder Mother   . Alcohol abuse Mother   . Ovarian cancer Paternal Grandmother 30  . Uterine cancer Paternal Grandmother 82       BRCA positive  . Breast cancer Paternal Grandmother 20  . Kidney cancer Paternal Grandfather 36  . Multiple myeloma Maternal Grandmother 2  . Pancreatic cancer Paternal Great-grandmother   . Pancreatic cancer Other    Past Surgical History:  Procedure Laterality Date  . FRACTURE SURGERY    . WRIST SURGERY  2011    Short Social History:  Social History   Tobacco Use  . Smoking status: Never Smoker  . Smokeless  tobacco: Never Used  Substance Use Topics  . Alcohol use: Not Currently    Alcohol/week: 0.0 standard drinks    Comment: occasionally    Allergies  Allergen Reactions  . Amoxicillin Rash    Current Outpatient Medications  Medication Sig Dispense Refill  . desonide (DESOWEN) 0.05 % lotion Apply topically 2 (two) times daily as needed. 59 mL 2  . sertraline (ZOLOFT) 100 MG tablet Take 100 mg by mouth daily.    Marland Kitchen triamcinolone (KENALOG) 0.025 % cream Apply 1 application topically 2 (two) times daily. 80 g 1  . valACYclovir (VALTREX) 1000 MG tablet Take 1 tablet (1,000 mg total) by mouth daily. 90 tablet 3  . Vitamin D, Ergocalciferol, (DRISDOL) 1.25 MG (50000 UNIT) CAPS capsule Take 1 capsule (50,000 Units total) by mouth every 7 (seven) days. 4 capsule 0  . misoprostol (CYTOTEC) 200 MCG tablet Place four tablets in between your gums and cheeks (two tablets on each side) as instructed OR insert four tablets vaginally, repeat every 6-hrs as needed (Patient not taking: Reported on 04/13/2020) 12 tablet 0   No current facility-administered medications for this visit.    Review of Systems  All other systems reviewed and are negative       Objective:  Objective   Vitals:   04/13/20 1059  BP: (!) 138/92  Weight: 202 lb (91.6  kg)  Height: _0  (1.575 m)   Body mass index is 36.95 kg/m.  Physical Exam Vitals reviewed.  Constitutional:      Appearance: Normal appearance.  HENT:     Head: Normocephalic.  Eyes:     Pupils: Pupils are equal, round, and reactive to light.  Cardiovascular:     Rate and Rhythm: Normal rate.  Pulmonary:     Effort: Pulmonary effort is normal.  Abdominal:     General: Abdomen is flat.     Palpations: Abdomen is soft.  Genitourinary:    Comments: deferred Musculoskeletal:        General: Normal range of motion.     Cervical back: Normal range of motion.  Skin:    General: Skin is warm and dry.  Neurological:     Mental Status: She is alert  and oriented to person, place, and time.  Psychiatric:        Mood and Affect: Mood normal.        Behavior: Behavior normal.     Assessment/Plan:    39 yo female, G1P0010 s/p MAB with medical management 2 weeks ago. TVUS wnl today.   -Reviewed TVUS results today - wnl -Encouraged to continue PNV daily -Patient desires pregnancy and will continue to try when ready -RTC as needed     Orlie Pollen, [place], [facility] 04/13/2020 11:09 AM

## 2020-06-04 ENCOUNTER — Other Ambulatory Visit: Payer: Self-pay

## 2020-06-04 ENCOUNTER — Ambulatory Visit
Admission: RE | Admit: 2020-06-04 | Discharge: 2020-06-04 | Disposition: A | Discharge status: Home / Self Care | Payer: 59 | Source: Ambulatory Visit | Attending: Obstetrics and Gynecology | Admitting: Obstetrics and Gynecology

## 2020-06-04 ENCOUNTER — Other Ambulatory Visit: Payer: 59

## 2020-06-04 DIAGNOSIS — N6311 Unspecified lump in the right breast, upper outer quadrant: Secondary | ICD-10-CM | POA: Diagnosis not present

## 2020-06-04 DIAGNOSIS — F4329 Adjustment disorder with other symptoms: Secondary | ICD-10-CM | POA: Diagnosis not present

## 2020-06-04 DIAGNOSIS — R928 Other abnormal and inconclusive findings on diagnostic imaging of breast: Secondary | ICD-10-CM

## 2020-06-04 DIAGNOSIS — R922 Inconclusive mammogram: Secondary | ICD-10-CM | POA: Diagnosis not present

## 2020-06-04 DIAGNOSIS — N6310 Unspecified lump in the right breast, unspecified quadrant: Secondary | ICD-10-CM

## 2020-06-05 ENCOUNTER — Other Ambulatory Visit: Payer: Self-pay

## 2020-06-18 DIAGNOSIS — F4329 Adjustment disorder with other symptoms: Secondary | ICD-10-CM | POA: Diagnosis not present

## 2020-06-21 ENCOUNTER — Encounter: Payer: Self-pay | Admitting: Family

## 2020-06-22 ENCOUNTER — Other Ambulatory Visit: Payer: Self-pay | Admitting: Family

## 2020-06-22 DIAGNOSIS — L209 Atopic dermatitis, unspecified: Secondary | ICD-10-CM

## 2020-06-29 ENCOUNTER — Other Ambulatory Visit: Payer: Self-pay | Admitting: Obstetrics and Gynecology

## 2020-06-29 DIAGNOSIS — N926 Irregular menstruation, unspecified: Secondary | ICD-10-CM

## 2020-07-01 ENCOUNTER — Other Ambulatory Visit: Payer: Self-pay | Admitting: Family

## 2020-07-01 DIAGNOSIS — B009 Herpesviral infection, unspecified: Secondary | ICD-10-CM

## 2020-07-13 ENCOUNTER — Other Ambulatory Visit: Payer: Self-pay | Admitting: Family

## 2020-07-13 ENCOUNTER — Other Ambulatory Visit: Payer: Self-pay | Admitting: Obstetrics and Gynecology

## 2020-07-13 DIAGNOSIS — N926 Irregular menstruation, unspecified: Secondary | ICD-10-CM

## 2020-07-13 DIAGNOSIS — Z3491 Encounter for supervision of normal pregnancy, unspecified, first trimester: Secondary | ICD-10-CM

## 2020-07-16 ENCOUNTER — Other Ambulatory Visit: Payer: Self-pay

## 2020-07-16 ENCOUNTER — Ambulatory Visit
Admission: RE | Admit: 2020-07-16 | Discharge: 2020-07-16 | Disposition: A | Discharge status: Home / Self Care | Payer: 59 | Source: Ambulatory Visit | Attending: Obstetrics and Gynecology | Admitting: Obstetrics and Gynecology

## 2020-07-16 DIAGNOSIS — N926 Irregular menstruation, unspecified: Secondary | ICD-10-CM | POA: Diagnosis not present

## 2020-07-16 DIAGNOSIS — Z3491 Encounter for supervision of normal pregnancy, unspecified, first trimester: Secondary | ICD-10-CM | POA: Insufficient documentation

## 2020-07-16 DIAGNOSIS — Z3A01 Less than 8 weeks gestation of pregnancy: Secondary | ICD-10-CM | POA: Diagnosis not present

## 2020-07-16 DIAGNOSIS — O26841 Uterine size-date discrepancy, first trimester: Secondary | ICD-10-CM | POA: Diagnosis not present

## 2020-07-16 DIAGNOSIS — Z369 Encounter for antenatal screening, unspecified: Secondary | ICD-10-CM | POA: Diagnosis not present

## 2020-07-17 ENCOUNTER — Other Ambulatory Visit: Payer: Self-pay | Admitting: Obstetrics and Gynecology

## 2020-07-17 ENCOUNTER — Other Ambulatory Visit (HOSPITAL_COMMUNITY)
Admission: RE | Admit: 2020-07-17 | Discharge: 2020-07-17 | Disposition: A | Discharge status: Home / Self Care | Payer: 59 | Source: Ambulatory Visit | Attending: Obstetrics and Gynecology | Admitting: Obstetrics and Gynecology

## 2020-07-17 ENCOUNTER — Encounter: Payer: Self-pay | Admitting: Obstetrics and Gynecology

## 2020-07-17 ENCOUNTER — Ambulatory Visit (INDEPENDENT_AMBULATORY_CARE_PROVIDER_SITE_OTHER): Payer: 59 | Admitting: Obstetrics and Gynecology

## 2020-07-17 VITALS — BP 112/70 | Wt 206.0 lb

## 2020-07-17 DIAGNOSIS — Z113 Encounter for screening for infections with a predominantly sexual mode of transmission: Secondary | ICD-10-CM

## 2020-07-17 DIAGNOSIS — Z130 Encounter for screening for diseases of the blood and blood-forming organs and certain disorders involving the immune mechanism: Secondary | ICD-10-CM | POA: Diagnosis not present

## 2020-07-17 DIAGNOSIS — Z3201 Encounter for pregnancy test, result positive: Secondary | ICD-10-CM

## 2020-07-17 DIAGNOSIS — O219 Vomiting of pregnancy, unspecified: Secondary | ICD-10-CM | POA: Insufficient documentation

## 2020-07-17 DIAGNOSIS — N898 Other specified noninflammatory disorders of vagina: Secondary | ICD-10-CM

## 2020-07-17 DIAGNOSIS — Z7185 Encounter for immunization safety counseling: Secondary | ICD-10-CM | POA: Diagnosis not present

## 2020-07-17 DIAGNOSIS — Z3481 Encounter for supervision of other normal pregnancy, first trimester: Secondary | ICD-10-CM | POA: Diagnosis not present

## 2020-07-17 DIAGNOSIS — Z3149 Encounter for other procreative investigation and testing: Secondary | ICD-10-CM

## 2020-07-17 DIAGNOSIS — O281 Abnormal biochemical finding on antenatal screening of mother: Secondary | ICD-10-CM | POA: Diagnosis not present

## 2020-07-17 DIAGNOSIS — Z3490 Encounter for supervision of normal pregnancy, unspecified, unspecified trimester: Secondary | ICD-10-CM | POA: Insufficient documentation

## 2020-07-17 DIAGNOSIS — Z0000 Encounter for general adult medical examination without abnormal findings: Secondary | ICD-10-CM | POA: Insufficient documentation

## 2020-07-17 LAB — POCT URINALYSIS DIPSTICK OB
Glucose, UA: NEGATIVE
POC,PROTEIN,UA: NEGATIVE

## 2020-07-17 LAB — POCT URINE PREGNANCY: Preg Test, Ur: POSITIVE — AB

## 2020-07-17 MED ORDER — DOXYLAMINE-PYRIDOXINE 10-10 MG PO TBEC: 2.0000 | DELAYED_RELEASE_TABLET | Freq: Every day | ORAL | 5 refills | Status: DC

## 2020-07-17 NOTE — Progress Notes (Signed)
New Obstetric Patient H&P    Chief Complaint: missed period, + home UPT   History of Present Illness: Patient is a 40 y.o. G2P0010 Not Hispanic or Latino female, presents with amenorrhea and positive home pregnancy test. Patient's last menstrual period was 05/27/2020. and based on her  LMP, her EDD is Estimated Date of Delivery: 03/03/21 and her EGA is [redacted]w[redacted]d Cycles are 4. days, regular, and occur approximately every : 28 days. Her last pap smear was on 11/11/19 and was NILM.    She had a urine pregnancy test which was positive 3 or 4 week(s)  ago. Her last menstrual period was normal and lasted for  4 day(s). Since her LMP she claims she has experienced an increase in nausea, breast tenderness, fatigue, and eczema flair. She denies vaginal bleeding. Her past medical history is significant for increased risk of breast cancer, BRCA negative. Her prior pregnancies are notable for first trimester pregnancy loss of di/di twins.  Since her LMP, she admits to the use of tobacco products  no She claims she has gained   8 pounds since the start of her pregnancy.  There are cats in the home in the home  no  She admits close contact with children on a regular basis  no  She has had chicken pox in the past no She has had Tuberculosis exposures, symptoms, or previously tested positive for TB   no Current or past history of domestic violence. no  Genetic Screening/Teratology Counseling: (Includes patient, baby's father, or anyone in either family with:)   [facility] 2. Thalassemia ([place] or [place] background): MCV<80  no 3. Neural tube defect (meningomyelocele, spina bifida, anencephaly)  no 4. Congenital heart defect  no  5. Down syndrome  no 6. Tay-Sachs ([place], [place]  no 7. Canavan's Disease  no 8. Sickle cell disease or trait (African)  no  9. Hemophilia or other blood disorders  no  10. Muscular dystrophy  no  11. Cystic fibrosis   no  12. Huntington's Chorea  no  13. Mental retardation/autism  no 14. Other inherited genetic or chromosomal disorder  no 15. Maternal metabolic disorder (DM, PKU, etc)  no 16. Patient or FOB with a child with a birth defect not listed above no  16a. Patient or FOB with a birth defect themselves no 17. Recurrent pregnancy loss, or stillbirth  no  18. Any medications since LMP other than prenatal vitamins (include vitamins, supplements, OTC meds, drugs, alcohol)  yes 19. Any other genetic/environmental exposure to discuss  no  Infection History:   1. Lives with someone with TB or TB exposed  no  2. Patient or partner has history of genital herpes  No - history of oral herpes ("cold sore" per patient) 3. Rash or viral illness since LMP  no 4. History of STI (GC, CT, HPV, syphilis, HIV)  no 5. 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 Patient lives with partner, Will.      Review of Systems:10 point review of systems negative unless otherwise noted in HPI  Past Medical History:  Patient Active Problem List   Diagnosis Date Noted  . Supervision of normal pregnancy 07/17/2020  . Nausea and vomiting during pregnancy 07/17/2020  . Abortion, missed 03/26/2020  . Atopic dermatitis 10/16/2019  . Vitamin D deficiency  10/03/2019  . 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 Class 2 severe obesity with serious comorbidity and body mass index (BMI) of 35.0 to 35.9 in adult ([place]) 10/03/2019  . Weight gain 07/16/2019  . Anxiety and depression 05/21/2019  . Herpes 05/21/2019  . Increased risk of breast cancer 05/11/2018    Tyrer-Cuzick lifetime breast cancer risk 22.7% 5 year risk 0.8% 03/28/2018 Dr Grayland Ormond- 05/2019: it would be reasonable to alternate yearly mammograms with yearly MRI.   Marland Kitchen BRCA negative 05/11/2018    MyRisk negative 03/28/2018     Past Surgical  History:  Past Surgical History:  Procedure Laterality Date  . FRACTURE SURGERY    . WRIST SURGERY  2011    Gynecologic History: Patient's last menstrual period was 05/27/2020.  Obstetric History: G2P0010  Family History:  Family History  Problem Relation Age of Onset  . Breast cancer Mother 40  . Pancreatic cancer Mother 77  . Hypertension Mother   . Cancer Mother   . Depression Mother   . Anxiety disorder Mother   . Alcohol abuse Mother   . Ovarian cancer Paternal Grandmother 90  . Uterine cancer Paternal Grandmother 2       BRCA positive  . Breast cancer Paternal Grandmother 6  . Kidney cancer Paternal Grandfather 57  . Multiple myeloma Maternal Grandmother 86  . Pancreatic cancer Paternal Great-grandmother   . Pancreatic cancer Other     Social History:  Social History   Socioeconomic History  . Marital status: Divorced    Spouse name: Not on file  . Number of children: Not on file  . Years of education: Not on file  . Highest education level: Not on file  Occupational History  . Not on file  Tobacco Use  . Smoking status: Never Smoker  . Smokeless tobacco: Never Used  Vaping Use  . Vaping Use: Never used  Substance and Sexual Activity  . Alcohol use: Not Currently    Alcohol/week: 0.0 standard drinks    Comment: occasionally  . Drug use: No  . Sexual activity: Yes    Birth control/protection: None  Other Topics Concern  . 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    Social Determinants of Health   Financial Resource Strain: Not on file  Food Insecurity: Not on file  Transportation Needs: Not on file  Physical Activity: Not on file  Stress: Not on file  Social Connections: Not on file  Intimate Partner Violence: Not on file    Allergies:  Allergies  Allergen Reactions  . Amoxicillin Rash    Medications: Prior to Admission medications   Medication Sig Start  Date End Date Taking? Authorizing Provider  desonide (DESOWEN) 0.05 % lotion Apply topically 2 (two) times daily as needed. 10/16/19  Yes Arnett, Yvetta Coder, FNP  Doxylamine-Pyridoxine (DICLEGIS) 10-10 MG TBEC Take 2 tablets by mouth at bedtime. If symptoms persist, add one tablet in the morning and one in the afternoon 07/17/20  Yes Maddox Hlavaty, CNM  sertraline (ZOLOFT) 100 MG tablet TAKE 1 TABLET (100 MG TOTAL) BY MOUTH AT BEDTIME. 07/13/20  Yes Arnett, Yvetta Coder, FNP  triamcinolone (KENALOG) 0.025 % cream Apply 1 application topically 2 (two) times daily. 11/13/19  Yes Burnard Hawthorne, FNP  valACYclovir (VALTREX) 1000 MG tablet TAKE 1 TABLET BY MOUTH DAILY. 07/01/20  Yes Arnett, Yvetta Coder, FNP  Vitamin D, Ergocalciferol, (DRISDOL) 1.25 MG (50000 UNIT)  CAPS capsule Take 1 capsule (50,000 Units total) by mouth every 7 (seven) days. 09/18/19  Yes Laqueta Linden, MD  misoprostol (CYTOTEC) 200 MCG tablet Place four tablets in between your gums and cheeks (two tablets on each side) as instructed OR insert four tablets vaginally, repeat every 6-hrs as needed Patient not taking: Reported on 04/13/2020 03/26/20   Orlie Pollen, CNM    Physical Exam Vitals: Blood pressure 112/70, weight 206 lb (93.4 kg), last menstrual period 05/27/2020.  General: NAD HEENT: normocephalic, anicteric Thyroid: no enlargement, no palpable nodules Pulmonary: No increased work of breathing Cardiovascular: RRR Abdomen: NABS, soft, non-tender, non-distended.  Umbilicus without lesions.  No hepatomegaly, splenomegaly or masses palpable. No evidence of hernia  Genitourinary:  External: Normal external female genitalia.  Normal urethral meatus, normal Bartholin's and Skene's glands.    Vagina: Normal vaginal mucosa, no evidence of prolapse.    Cervix: Grossly normal in appearance, no bleeding  Bimanual deferred - [place] yesterday  Rectal: deferred Extremities: no edema, erythema, or tenderness Neurologic: Grossly  intact Psychiatric: mood appropriate, affect full   Assessment: 40 y.o. G2P0010 at 4w2dpresenting to initiate prenatal care  Plan: 1) Avoid alcoholic beverages. 2) Patient encouraged not to smoke.  3) Discontinue the use of all non-medicinal drugs and chemicals.  4) Take prenatal vitamins daily.  5) Nutrition, food safety (fish, cheese advisories, and high nitrite foods) and exercise discussed. 6) Hospital and practice style discussed with cross coverage system.  7) Genetic Screening, such as with 1st Trimester Screening, cell free fetal DNA, AFP testing, and Ultrasound, as well as with amniocentesis and CVS as appropriate, is discussed with patient. At the conclusion of today's visit patient requested genetic testing - Inheritest and Sickle Cell screening today, NIPS at next visit  8) NOB labs/GC/CT/urine collected today 9) N/V in pregnancy - diclegis rx'd, provided information in AVS for OTC option     KOrlie Pollen CNM, MSN [facility], [place]4/04/2020, 9:42 AM

## 2020-07-17 NOTE — Patient Instructions (Signed)
For nausea (these may be purchased over-the-counter): -Vitamin B6 (pyridoxine):  25 mg three times each day (may buy 100 mg tablet and take twice per day or try to cut into 4 equal pieces and take 1 piece three times each day).  - doxylamine (found in [employer] and other sleep agents that can be bought in the store): take 12.5 - 25 mg at bedtime.  May take up to 25 mg three time each day.  However, keep in mind that this might make you sleepy.    Obstetrics: Normal and Problem Pregnancies (7th ed., pp. 102-121). [place], [place]: Elsevier."> Textbook of Family Medicine (9th ed., pp. 365-410). [place], [place]: Elsevier [employer].">  First Trimester of Pregnancy  The first trimester of pregnancy starts on the first day of your last menstrual period until the end of week 12. This is months 1 through 3 of pregnancy. A week after a sperm fertilizes an egg, the egg will implant into the wall of the uterus and begin to develop into a baby. By the end of 12 weeks, all the baby's organs will be formed and the baby will be 2-3 inches in size. Body changes during your first trimester Your body goes through many changes during pregnancy. The changes vary and generally return to normal after your baby is born. Physical changes  You may gain or lose weight.  Your breasts may begin to grow larger and become tender. The tissue that surrounds your nipples (areola) may become darker.  Dark spots or blotches (chloasma or mask of pregnancy) may develop on your face.  You may have changes in your hair. These can include thickening or thinning of your hair or changes in texture. Health changes  You may feel nauseous, and you may vomit.  You may have heartburn.  You may develop headaches.  You may develop constipation.  Your gums may bleed and may be sensitive to brushing and flossing. Other changes  You may tire easily.  You may urinate more often.  Your menstrual periods will stop.  You may have a  loss of appetite.  You may develop cravings for certain kinds of food.  You may have changes in your emotions from day to day.  You may have more vivid and strange dreams. Follow these instructions at home: Medicines  Follow your health care provider's instructions regarding medicine use. Specific medicines may be either safe or unsafe to take during pregnancy. Do not take any medicines unless told to by your health care provider.  Take a prenatal vitamin that contains at least 600 micrograms (mcg) of folic acid. Eating and drinking  Eat a healthy diet that includes fresh fruits and vegetables, whole grains, good sources of protein such as meat, eggs, or tofu, and low-fat dairy products.  Avoid raw meat and unpasteurized juice, milk, and cheese. These carry germs that can harm you and your baby.  If you feel nauseous or you vomit: ? Eat 4 or 5 small meals a day instead of 3 large meals. ? Try eating a few soda crackers. ? Drink liquids between meals instead of during meals.  You may need to take these actions to prevent or treat constipation: ? Drink enough fluid to keep your urine pale yellow. ? Eat foods that are high in fiber, such as beans, whole grains, and fresh fruits and vegetables. ? Limit foods that are high in fat and processed sugars, such as fried or sweet foods. Activity  Exercise only as directed by your health   care provider. Most people can continue their usual exercise routine during pregnancy. Try to exercise for 30 minutes at least 5 days a week.  Stop exercising if you develop pain or cramping in the lower abdomen or lower back.  Avoid exercising if it is very hot or humid or if you are at high altitude.  Avoid heavy lifting.  If you choose to, you may have sex unless your health care provider tells you not to. Relieving pain and discomfort  Wear a good support bra to relieve breast tenderness.  Rest with your legs elevated if you have leg cramps or low  back pain.  If you develop bulging veins (varicose veins) in your legs: ? Wear support hose as told by your health care provider. ? Elevate your feet for 15 minutes, 3-4 times a day. ? Limit salt in your diet. Safety  Wear your seat belt at all times when driving or riding in a car.  Talk with your health care provider if someone is verbally or physically abusive to you.  Talk with your health care provider if you are feeling sad or have thoughts of hurting yourself. Lifestyle  Do not use hot tubs, steam rooms, or saunas.  Do not douche. Do not use tampons or scented sanitary pads.  Do not use herbal remedies, alcohol, illegal drugs, or medicines that are not approved by your health care provider. Chemicals in these products can harm your baby.  Do not use any products that contain nicotine or tobacco, such as cigarettes, e-cigarettes, and chewing tobacco. If you need help quitting, ask your health care provider.  Avoid cat litter boxes and soil used by cats. These carry germs that can cause birth defects in the baby and possibly loss of the unborn baby (fetus) by miscarriage or stillbirth. General instructions  During routine prenatal visits in the first trimester, your health care provider will do a physical exam, perform necessary tests, and ask you how things are going. Keep all follow-up visits. This is important.  Ask for help if you have counseling or nutritional needs during pregnancy. Your health care provider can offer advice or refer you to specialists for help with various needs.  Schedule a dentist appointment. At home, brush your teeth with a soft toothbrush. Floss gently.  Write down your questions. Take them to your prenatal visits. Where to find more information  American Pregnancy Association: americanpregnancy.org  American College of Obstetricians and Gynecologists: acog.org/en/Womens%20Health/Pregnancy  Office on Women's Health:  womenshealth.gov/pregnancy Contact a health care provider if you have:  Dizziness.  A fever.  Mild pelvic cramps, pelvic pressure, or nagging pain in the abdominal area.  Nausea, vomiting, or diarrhea that lasts for 24 hours or longer.  A bad-smelling vaginal discharge.  Pain when you urinate.  Known exposure to a contagious illness, such as chickenpox, measles, Zika virus, HIV, or hepatitis. Get help right away if you have:  Spotting or bleeding from your vagina.  Severe abdominal cramping or pain.  Shortness of breath or chest pain.  Any kind of trauma, such as from a fall or a car crash.  New or increased pain, swelling, or redness in an arm or leg. Summary  The first trimester of pregnancy starts on the first day of your last menstrual period until the end of week 12 (months 1 through 3).  Eating 4 or 5 small meals a day rather than 3 large meals may help to relieve nausea and vomiting.  Do not use any   products that contain nicotine or tobacco, such as cigarettes, e-cigarettes, and chewing tobacco. If you need help quitting, ask your health care provider.  Keep all follow-up visits. This is important. This information is not intended to replace advice given to you by your health care provider. Make sure you discuss any questions you have with your health care provider. Document Revised: 09/11/2019 Document Reviewed: 07/18/2019 Elsevier Patient Education  2021 Elsevier Inc.   

## 2020-07-19 LAB — CERVICOVAGINAL ANCILLARY ONLY
Chlamydia: NEGATIVE
Comment: NEGATIVE
Comment: NORMAL
Neisseria Gonorrhea: NEGATIVE

## 2020-07-19 LAB — URINE CULTURE

## 2020-07-20 LAB — RPR+RH+ABO+RUB AB+AB SCR+CB...
Antibody Screen: NEGATIVE
HIV Screen 4th Generation wRfx: NONREACTIVE
Hematocrit: 36.3 % (ref 34.0–46.6)
Hemoglobin: 12.1 g/dL (ref 11.1–15.9)
Hepatitis B Surface Ag: NEGATIVE
MCH: 29.6 pg (ref 26.6–33.0)
MCHC: 33.3 g/dL (ref 31.5–35.7)
MCV: 89 fL (ref 79–97)
Platelets: 319 10*3/uL (ref 150–450)
RBC: 4.09 x10E6/uL (ref 3.77–5.28)
RDW: 13.3 % (ref 11.7–15.4)
RPR Ser Ql: NONREACTIVE
Rh Factor: POSITIVE
Rubella Antibodies, IGG: 12.7 index (ref >0.99)
Varicella zoster IgG: 636 index (ref >165)
WBC: 7.8 10*3/uL (ref 3.4–10.8)

## 2020-07-20 LAB — HGB FRACTIONATION CASCADE
Hgb A2: 3.3 % — ABNORMAL HIGH (ref 1.8–3.2)
Hgb A: 96.7 % (ref 96.4–98.8)
Hgb F: 0 % (ref 0.0–2.0)
Hgb S: 0 %

## 2020-07-21 ENCOUNTER — Other Ambulatory Visit: Payer: Self-pay | Admitting: Obstetrics and Gynecology

## 2020-07-21 DIAGNOSIS — D582 Other hemoglobinopathies: Secondary | ICD-10-CM

## 2020-07-21 NOTE — Progress Notes (Signed)
Spoke with patient via phone. Utilized two patient identifiers. Reviewed NOB labs including result of mildly elevated Hgb A2 on hgb fractionation cascade. Recommended referral to hematology. Patient stated understanding, all questions answered. Orders placed.

## 2020-07-27 ENCOUNTER — Ambulatory Visit: Payer: 59 | Admitting: Dermatology

## 2020-07-27 ENCOUNTER — Other Ambulatory Visit: Payer: Self-pay

## 2020-07-27 DIAGNOSIS — R21 Rash and other nonspecific skin eruption: Secondary | ICD-10-CM

## 2020-07-27 MED ORDER — TRIAMCINOLONE ACETONIDE 0.1 % EX OINT
TOPICAL_OINTMENT | CUTANEOUS | 1 refills | Status: DC
  Filled 2020-07-27: qty 80, 15d supply, fill #0
  Filled 2020-07-27: qty 80, 20d supply, fill #0

## 2020-07-27 NOTE — Patient Instructions (Signed)
Topical steroids (such as triamcinolone, fluocinolone, fluocinonide, mometasone, clobetasol, halobetasol, betamethasone, hydrocortisone) can cause thinning and lightening of the skin if they are used for too long in the same area. Your physician has selected the right strength medicine for your problem and area affected on the body. Please use your medication only as directed by your physician to prevent side effects.   Avoid applying to face, groin, and axilla. Use as directed. Risk of skin atrophy with long-term use reviewed.      If you have any questions or concerns for your doctor, please call our main line at [phone] and press option 4 to reach your doctor's medical assistant. If no one answers, please leave a voicemail as directed and we will return your call as soon as possible. Messages left after 4 pm will be answered the following business day.   You may also send us a message via MyChart. We typically respond to MyChart messages within 1-2 business days.  For prescription refills, please ask your pharmacy to contact our office. Our fax number is [phone].  If you have an urgent issue when the clinic is closed that cannot wait until the next business day, you can page your doctor at the number below.    Please note that while we do our best to be available for urgent issues outside of office hours, we are not available 24/7.   If you have an urgent issue and are unable to reach us, you may choose to seek medical care at your doctor's office, retail clinic, urgent care center, or emergency room.  If you have a medical emergency, please immediately call 911 or go to the emergency department.  Pager Numbers  - Dr. Kowalski: [phone]  - Dr. Moye: [phone]  - Dr. Stewart: [phone]  In the event of inclement weather, please call our main line at [phone] for an update on the status of any delays or closures.  Dermatology Medication Tips: Please keep the  boxes that topical medications come in in order to help keep track of the instructions about where and how to use these. Pharmacies typically print the medication instructions only on the boxes and not directly on the medication tubes.   If your medication is too expensive, please contact our office at [phone] option 4 or send us a message through MyChart.   We are unable to tell what your co-pay for medications will be in advance as this is different depending on your insurance coverage. However, we may be able to find a substitute medication at lower cost or fill out paperwork to get insurance to cover a needed medication.   If a prior authorization is required to get your medication covered by your insurance company, please allow us 1-2 business days to complete this process.  Drug prices often vary depending on where the prescription is filled and some pharmacies may offer cheaper prices.  The website www.goodrx.com contains coupons for medications through different pharmacies. The prices here do not account for what the cost may be with help from insurance (it may be cheaper with your insurance), but the website can give you the price if you did not use any insurance.  - You can print the associated coupon and take it with your prescription to the pharmacy.  - You may also stop by our office during regular business hours and pick up a GoodRx coupon card.  - If you need your prescription sent electronically to a different pharmacy, notify our office   through [place] MyChart or by phone at [phone] option 4.  

## 2020-07-27 NOTE — Progress Notes (Signed)
   New Patient Visit  Subjective  Angie Tucker is a 40 y.o. female who presents for the following: New Patient (Initial Visit) (Patient as a new patient with concerns about eczema and states that it has gotten worse in the last few months.  She is currently 8 weeks and 5 days pregnant. States eczema continues to get worst despite cream she is using. She is currently using Curel eczema relief and TMC 0.025 % cream. Patient states she has active areas at [employer] area, back, forearms. /).  Patient uses dove sensitive skin soap. Patient noticed first spots in November.    Objective  Well appearing patient in no apparent distress; mood and affect are within normal limits.  A focused examination was performed including bilateral forearms and back. Relevant physical exam findings are noted in the Assessment and Plan.  Objective  Right Forearm - Anterior: See photos   Images        Assessment & Plan  Rash and other nonspecific skin eruption Right Forearm - Anterior  Possible lupus, scardosis, granuloma annulare, pityriasis rosea  KOH performed in office today and negative for fungal elements  Recommend order labs today   Defer biopsy today given in first trimester (also only have bupivacaine available in clinic and no lidocaine due to shortages)  Start Triamcinolone 0.1% ointment apply to affected itchy areas twice a day as needed up to 2 weeks then weekends only.  Avoid applying to face, groin, and axilla. Use as directed. Risk of skin atrophy with long-term use reviewed.   Topical steroids (such as triamcinolone, fluocinolone, fluocinonide, mometasone, clobetasol, halobetasol, betamethasone, hydrocortisone) can cause thinning and lightening of the skin if they are used for too long in the same area. Your physician has selected the right strength medicine for your problem and area affected on the body. Please use your medication only as directed by your physician to prevent side  effects.      Angiotensin converting enzyme - Right Forearm - Anterior  ANA w/Reflex - Right Forearm - Anterior  triamcinolone ointment (KENALOG) 0.1 % - Right Forearm - Anterior  Return for schedule follow up when pt in second trimester .   I, Asher Muir, CMA, am acting as scribe for Darden Dates, MD.  Documentation: I have reviewed the above documentation for accuracy and completeness, and I agree with the above.  Darden Dates, MD

## 2020-07-28 ENCOUNTER — Other Ambulatory Visit: Payer: Self-pay

## 2020-07-28 LAB — INHERITEST CORE(CF97,SMA,FRAX)

## 2020-07-28 MED FILL — Doxylamine-Pyridoxine Tab Delayed Release 10-10 MG: ORAL | 25 days supply | Qty: 100 | Fill #0 | Status: CN

## 2020-07-29 LAB — ANA W/REFLEX: ANA Titer 1: NEGATIVE

## 2020-07-29 LAB — ANGIOTENSIN CONVERTING ENZYME: Angio Convert Enzyme: 47 U/L (ref 14–82)

## 2020-07-30 ENCOUNTER — Telehealth: Payer: Self-pay

## 2020-07-30 NOTE — Telephone Encounter (Signed)
-----   Message from Sandi Mealy, MD sent at 07/29/2020  2:48 PM EDT ----- Normal ANA and ACE tests.  ANA is typically elevated in lupus, ACE can sometimes be elevated in sarcoidosis.  Would continue treatment with triamcinolone and wait until third trimester for biopsy - this could be a benign condition called granuloma annulare or one of the other conditions we discussed in clinic.  MAs please call. Thank you!

## 2020-07-30 NOTE — Telephone Encounter (Signed)
Patient advised blood work normal, will wait until third trimester to do biopsy. Can continue using TMC 0.1% ointment as prescribed, JS

## 2020-08-03 ENCOUNTER — Encounter: Payer: Self-pay | Admitting: Dermatology

## 2020-08-10 ENCOUNTER — Encounter: Payer: Self-pay | Admitting: Oncology

## 2020-08-10 ENCOUNTER — Inpatient Hospital Stay: Payer: 59

## 2020-08-10 ENCOUNTER — Inpatient Hospital Stay: Payer: 59 | Attending: Oncology | Admitting: Oncology

## 2020-08-10 ENCOUNTER — Other Ambulatory Visit: Payer: Self-pay

## 2020-08-10 VITALS — BP 118/78 | HR 68 | Temp 97.9°F | Resp 16 | Wt 204.5 lb

## 2020-08-10 DIAGNOSIS — D582 Other hemoglobinopathies: Secondary | ICD-10-CM | POA: Insufficient documentation

## 2020-08-10 DIAGNOSIS — Z3491 Encounter for supervision of normal pregnancy, unspecified, first trimester: Secondary | ICD-10-CM

## 2020-08-10 DIAGNOSIS — Z3A01 Less than 8 weeks gestation of pregnancy: Secondary | ICD-10-CM | POA: Diagnosis not present

## 2020-08-10 NOTE — Progress Notes (Addendum)
Hematology/Oncology Consult note [facility] [phone] [phone]   Patient Care Team: Burnard Hawthorne, FNP as PCP - General (Family Medicine)  REFERRING PROVIDER: Orlie Pollen, CNM  CHIEF COMPLAINTS/REASON FOR VISIT:  Evaluation of elevated hemoglobin A2.  HISTORY OF PRESENTING ILLNESS:   Angie Tucker is a  40 y.o.  female with PMH listed below was seen in consultation at the request of  Orlie Pollen, CNM  for evaluation of elevation of hemoglobin A2.  Patient is currently in first trimester, 10 weeks. She has had routine blood work which include hemoglobin evaluation. Hemoglobinopathy evaluation showed increased hemoglobin A2 slightly at 3.3%.[Normal reference 1.8-3.2%]. Patient has no anemia.  Reports no complication with current pregnancy. Denies any family history of thalassemia. Patient was referred to establish care with hematology for further evaluation.  Review of Systems  Constitutional: Negative for appetite change, chills, fatigue and fever.  HENT:   Negative for hearing loss and voice change.   Eyes: Negative for eye problems.  Respiratory: Negative for chest tightness and cough.   Cardiovascular: Negative for chest pain.  Gastrointestinal: Negative for abdominal distention, abdominal pain and blood in stool.  Endocrine: Negative for hot flashes.  Genitourinary: Negative for difficulty urinating and frequency.   Musculoskeletal: Negative for arthralgias.  Skin: Negative for itching and rash.  Neurological: Negative for extremity weakness.  Hematological: Negative for adenopathy.  Psychiatric/Behavioral: Negative for confusion.    MEDICAL HISTORY:  Past Medical History:  Diagnosis Date  . Anxiety   . BRCA negative 03/2018   MyRisk neg  . Broken wrist Right Wrist  . Depression   . Depression   . Family history of BRCA gene positive    PGM with ovarian cancer  . Family history of breast cancer   . Family  history of ovarian cancer   . Family history of pancreatic cancer   . Herpes     SURGICAL HISTORY: Past Surgical History:  Procedure Laterality Date  . FRACTURE SURGERY    . WRIST SURGERY  2011    SOCIAL HISTORY: Social History   Socioeconomic History  . Marital status: Divorced    Spouse name: Not on file  . Number of children: Not on file  . Years of education: Not on file  . Highest education level: Not on file  Occupational History  . Not on file  Tobacco Use  . Smoking status: Never Smoker  . Smokeless tobacco: Never Used  Vaping Use  . Vaping Use: Never used  Substance and Sexual Activity  . Alcohol use: Not Currently    Alcohol/week: 0.0 standard drinks    Comment: occasionally  . Drug use: No  . Sexual activity: Yes    Birth control/protection: None  Other Topics Concern  . 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    Social Determinants of Health   Financial Resource Strain: Not on file  Food Insecurity: Not on file  Transportation Needs: Not on file  Physical Activity: Not on file  Stress: Not on file  Social Connections: Not on file  Intimate Partner Violence: Not on file    FAMILY HISTORY: Family History  Problem Relation Age of Onset  . Breast cancer Mother 42  . Pancreatic cancer Mother 32  . Hypertension Mother   . Cancer Mother   . Depression Mother   . Anxiety disorder Mother   . Alcohol abuse  Mother   . Ovarian cancer Paternal Grandmother 54  . Uterine cancer Paternal Grandmother 56       BRCA positive  . Breast cancer Paternal Grandmother 32  . Kidney cancer Paternal Grandfather 54  . Multiple myeloma Maternal Grandmother 33  . Pancreatic cancer Paternal Great-grandmother   . Pancreatic cancer Other     ALLERGIES:  is allergic to amoxicillin.  MEDICATIONS:  Current Outpatient Medications  Medication Sig Dispense Refill  . Doxylamine-Pyridoxine 10-10  MG TBEC TAKE 2 TABLETS BY MOUTH AT BEDTIME. IF SYMPTOMS PERSIST, ADD ONE TABLET IN THE MORNING AND ONE IN THE AFTERNOON 100 tablet 5  . sertraline (ZOLOFT) 100 MG tablet TAKE 1 TABLET (100 MG TOTAL) BY MOUTH AT BEDTIME. 90 tablet 3  . triamcinolone ointment (KENALOG) 0.1 % Apply topically to affected areas twice daily as needed for itch   Avoid applying to face, groin, and axilla. Use as directed. 80 g 1  . valACYclovir (VALTREX) 1000 MG tablet TAKE 1 TABLET BY MOUTH DAILY. 90 tablet 3  . triamcinolone (KENALOG) 0.025 % cream APPLY 1 APPLICATION TOPICALLY 2 (TWO) TIMES DAILY. 80 g 1   No current facility-administered medications for this visit.     PHYSICAL EXAMINATION: ECOG PERFORMANCE STATUS: 0 - Asymptomatic Vitals:   08/10/20 0944  BP: 118/78  Pulse: 68  Resp: 16  Temp: 97.9 F (36.6 C)   Filed Weights   08/10/20 0944  Weight: 204 lb 8 oz (92.8 kg)    Physical Exam Constitutional:      General: She is not in acute distress. HENT:     Head: Normocephalic and atraumatic.  Eyes:     General: No scleral icterus. Cardiovascular:     Rate and Rhythm: Normal rate and regular rhythm.     Heart sounds: Normal heart sounds.  Pulmonary:     Effort: Pulmonary effort is normal. No respiratory distress.     Breath sounds: No wheezing.  Abdominal:     General: Bowel sounds are normal. There is no distension.     Palpations: Abdomen is soft.  Musculoskeletal:        General: No deformity. Normal range of motion.     Cervical back: Normal range of motion and neck supple.  Skin:    General: Skin is warm and dry.     Findings: No erythema or rash.  Neurological:     Mental Status: She is alert and oriented to person, place, and time. Mental status is at baseline.     Cranial Nerves: No cranial nerve deficit.     Coordination: Coordination normal.  Psychiatric:        Mood and Affect: Mood normal.     LABORATORY DATA:  I have reviewed the data as listed Lab Results   Component Value Date   WBC 7.8 07/17/2020   HGB 12.1 07/17/2020   HCT 36.3 07/17/2020   MCV 89 07/17/2020   PLT 319 07/17/2020   No results for input(s): NA, K, CL, CO2, GLUCOSE, BUN, CREATININE, CALCIUM, GFRNONAA, GFRAA, PROT, ALBUMIN, AST, ALT, ALKPHOS, BILITOT, BILIDIR, IBILI in the last 8760 hours. Iron/TIBC/Ferritin/ %Sat No results found for: IRON, TIBC, FERRITIN, IRONPCTSAT    RADIOGRAPHIC STUDIES: I have personally reviewed the radiological images as listed and agreed with the findings in the report. US OB LESS THAN 14 WEEKS WITH OB TRANSVAGINAL  Result Date: 07/16/2020 CLINICAL DATA:  Dating EXAM: OBSTETRIC <14 WK [place] AND TRANSVAGINAL OB US TECHNIQUE: Both transabdominal and transvaginal ultrasound examinations  were performed for complete evaluation of the gestation as well as the maternal uterus, adnexal regions, and pelvic cul-de-sac. Transvaginal technique was performed to assess early pregnancy. COMPARISON:  None. FINDINGS: Intrauterine gestational sac: Single Yolk sac:  Visualized. Embryo:  Visualized. Cardiac Activity: Visualized. Heart Rate: 133 bpm MSD:   mm    w     d CRL:  10.2 mm   7 w   1 d                  [place] EDC: 03/03/2021 Subchorionic hemorrhage:  None visualized. Maternal uterus/adnexae: Right corpus luteal cyst. No adnexal mass or free fluid. IMPRESSION: Seven week 1 day intrauterine pregnancy. Fetal heart rate 133 beats per minute. No acute maternal findings. Electronically Signed   By: Rolm Baptise M.D.   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 First trimester pregnancy    #Labs reviewed and discussed with patient. Increase hemoglobin A2, mired.  This may indicate beta thalassemia minor,  Check beta thalassemia gene sequencing to clarify diagnosis. Discussed with patient that if she is tested positive for beta thalassemia minor, I recommend she to establish care with prenatal counseling.  She voices understanding. I do  not think she needs to follow-up with [place] at this point.  I will communicate with her after her results are back..  Orders Placed This Encounter  Procedures  . Miscellaneous [facility] test (send-out)    Standing Status:   Future    Number of Occurrences:   1    Standing Expiration Date:   08/10/2021    Order Specific Question:   Test name / description:    Answer:   B-Thalassemia: HBB (Full gene sequencing) - TEST: [id number]    All questions were answered. The patient knows to call the clinic with any problems questions or concerns.  cc Orlie Pollen, CNM   Thank you for this kind referral and the opportunity to participate in the care of this patient. A copy of today's note is routed to referring provider    Earlie Server, MD, PhD Hematology Oncology [facility] Pager- [phone] 08/10/2020  Addendum:  Labs are reviewed. HBB sequencing showed variants, not supporting a diagnosis of thalassemia. A2 is only slightly elevated, this is less than the typical levels of beta thalassemia. Patient also has normal hemoglobin and MCV, both are against diagnosis of beta thalassemia.   Earlie Server

## 2020-08-10 NOTE — Progress Notes (Signed)
New patient evaluation.   

## 2020-08-12 ENCOUNTER — Other Ambulatory Visit: Payer: Self-pay

## 2020-08-12 ENCOUNTER — Encounter: Payer: Self-pay | Admitting: Obstetrics and Gynecology

## 2020-08-12 ENCOUNTER — Ambulatory Visit (INDEPENDENT_AMBULATORY_CARE_PROVIDER_SITE_OTHER): Payer: 59 | Admitting: Obstetrics and Gynecology

## 2020-08-12 VITALS — BP 100/70 | Wt 204.0 lb

## 2020-08-12 DIAGNOSIS — Z3A11 11 weeks gestation of pregnancy: Secondary | ICD-10-CM

## 2020-08-12 DIAGNOSIS — Z3481 Encounter for supervision of other normal pregnancy, first trimester: Secondary | ICD-10-CM | POA: Diagnosis not present

## 2020-08-12 DIAGNOSIS — Z131 Encounter for screening for diabetes mellitus: Secondary | ICD-10-CM | POA: Diagnosis not present

## 2020-08-12 DIAGNOSIS — Z1379 Encounter for other screening for genetic and chromosomal anomalies: Secondary | ICD-10-CM | POA: Diagnosis not present

## 2020-08-12 DIAGNOSIS — Z3689 Encounter for other specified antenatal screening: Secondary | ICD-10-CM | POA: Diagnosis not present

## 2020-08-12 LAB — POCT URINALYSIS DIPSTICK OB
Glucose, UA: NEGATIVE
POC,PROTEIN,UA: NEGATIVE

## 2020-08-12 NOTE — Progress Notes (Signed)
ROB- no concerns 

## 2020-08-12 NOTE — Progress Notes (Addendum)
Routine Prenatal Care Visit  Subjective  Angie Tucker is a 40 y.o. G2P0010 at 92w0dbeing seen today for ongoing prenatal care.  She is currently monitored for the following issues for this low-risk pregnancy and has Increased risk of breast cancer; BRCA negative; Anxiety and depression; Herpes; Vitamin D deficiency; Insulin resistance; Class 2 severe obesity with serious comorbidity and body mass index (BMI) of 35.0 to 35.9 in adult ([facility]; Atopic dermatitis; Abortion, missed; Supervision of normal pregnancy; and Nausea and vomiting during pregnancy on their problem list.  ----------------------------------------------------------------------------------- Patient reports no complaints.  N/V has improved with diclegis administration. Notes some food aversions. Contractions: Not present. Vag. Bleeding: None.   . Denies leaking of fluid.  ----------------------------------------------------------------------------------- The following portions of the patient's history were reviewed and updated as appropriate: allergies, current medications, past family history, past medical history, past social history, past surgical history and problem list. Problem list updated.   Objective  Blood pressure 100/70, weight 204 lb (92.5 kg), last menstrual period 05/27/2020. Pregravid weight 198 lb (89.8 kg) Total Weight Gain 6 lb (2.722 kg) Urinalysis:      Fetal Status: Fetal Heart Rate (bpm): 155         General:  Alert, oriented and cooperative. Patient is in no acute distress.  Skin: Skin is warm and dry. No rash noted.   Cardiovascular: Normal heart rate noted  Respiratory: Normal respiratory effort, no problems with respiration noted  Abdomen: Soft, gravid, appropriate for gestational age. Pain/Pressure: Absent     Pelvic:  Cervical exam deferred        Extremities: Normal range of motion.     ental Status: Normal mood and affect. Normal behavior. Normal judgment and thought content.      Assessment   40y.o. G2P0010 at 187w0dy  03/03/2021, by Last Menstrual Period presenting for routine prenatal visit  Plan   pregnancy2 Problems (from 05/27/20 to present)    Problem Noted Resolved   Supervision of normal pregnancy 07/17/2020 by VeOrlie PollenCNM No   Overview Signed 07/17/2020  9:52 AM by VeOrlie PollenCNM     Nursing Staff Provider  Office Location  [facility] Dating   LMP = 7w [place]  English Anatomy [place] ordered  Flu Vaccine   allergic Genetic Screen  NIPS:   collected  TDaP vaccine    Hgb A1C or  GTT Early : Third trimester :   Rhogam   n/a   LAB RESULTS   Feeding Plan  breast Blood Type O/Positive/-- (12/09 1525)   Contraception  Antibody  negative  Circumcision  Rubella  immune  Pediatrician   RPR   Non-reactive  Support Person  Will HBsAg   negative  Prenatal Classes  provided resources at [facility] HIV   Non-reactive   COVID  vaccinated Varicella  immune   BTL Consent  n/a GBS  (For PCN allergy, check sensitivities)        VBAC Consent  n/a Pap  UTD - 7/21 - NILM    Hgb Electro   A2 borderline elevated - screening for beta thal minor by heme    CF  negative     SMA  negative                 -Discussed early screening for GDM given history of insulin resistance and BMI. Patient desires completing hgb A1C today. Will consider 1h GTT. -Plan for anatomy scan in 7 weeks [place].  Gestational age appropriate obstetric precautions including but not limited to vaginal bleeding, contractions, leaking of fluid and fetal movement were reviewed in detail with the patient.    Return in about 4 weeks (around 09/09/2020) for ROB - Anatomy US ordered placed for [facility] weeks at [facility].  Orlie Pollen, CNM, MSN [facility], [facility] 08/12/2020, 8:48 AM

## 2020-08-13 LAB — HGB A1C W/O EAG: Hgb A1c MFr Bld: 5.4 % (ref 4.8–5.6)

## 2020-08-17 LAB — MATERNIT 21 PLUS CORE, BLOOD
Fetal Fraction: 5
Result (T21): NEGATIVE
Trisomy 13 (Patau syndrome): NEGATIVE
Trisomy 18 (Edwards syndrome): NEGATIVE
Trisomy 21 (Down syndrome): NEGATIVE

## 2020-08-18 ENCOUNTER — Encounter: Payer: Self-pay | Admitting: Oncology

## 2020-08-18 LAB — MISC LABCORP TEST (SEND OUT): Labcorp test code: 252823

## 2020-09-02 ENCOUNTER — Ambulatory Visit: Payer: 59

## 2020-09-08 ENCOUNTER — Other Ambulatory Visit: Payer: Self-pay

## 2020-09-08 ENCOUNTER — Ambulatory Visit (INDEPENDENT_AMBULATORY_CARE_PROVIDER_SITE_OTHER): Payer: 59 | Admitting: Obstetrics and Gynecology

## 2020-09-08 ENCOUNTER — Encounter: Payer: Self-pay | Admitting: Obstetrics and Gynecology

## 2020-09-08 VITALS — BP 118/68 | Wt 203.0 lb

## 2020-09-08 DIAGNOSIS — Z3A14 14 weeks gestation of pregnancy: Secondary | ICD-10-CM | POA: Diagnosis not present

## 2020-09-08 DIAGNOSIS — Z3482 Encounter for supervision of other normal pregnancy, second trimester: Secondary | ICD-10-CM

## 2020-09-08 LAB — POCT URINALYSIS DIPSTICK OB
Glucose, UA: NEGATIVE
POC,PROTEIN,UA: NEGATIVE

## 2020-09-08 NOTE — Progress Notes (Signed)
Routine Prenatal Care Visit  Subjective  Angie Tucker is a 40 y.o. G2P0010 at 51w6dbeing seen today for ongoing prenatal care.  She is currently monitored for the following issues for this low-risk pregnancy and has Increased risk of breast cancer; BRCA negative; Anxiety and depression; Vitamin D deficiency; Insulin resistance; Class 2 severe obesity with serious comorbidity and body mass index (BMI) of 35.0 to 35.9 in adult ([facility]; Atopic dermatitis; Abortion, missed; Supervision of normal pregnancy; and Nausea and vomiting during pregnancy on their problem list.  ----------------------------------------------------------------------------------- Patient reports intermittent headaches noted first thing in the morning. Patient reports she is currently being fitted for a bite gaurd - unsure if HAs are related to this. Patient states HAs resolve with tylenol..   Contractions: Not present. Vag. Bleeding: None.   . Denies leaking of fluid.  ----------------------------------------------------------------------------------- The following portions of the patient's history were reviewed and updated as appropriate: allergies, current medications, past family history, past medical history, past social history, past surgical history and problem list. Problem list updated.   Objective  Blood pressure 118/68, weight 203 lb (92.1 kg), last menstrual period 05/27/2020. Pregravid weight 198 lb (89.8 kg) Total Weight Gain 5 lb (2.268 kg) Urinalysis:      Fetal Status: Fetal Heart Rate (bpm): 153         General:  Alert, oriented and cooperative. Patient is in no acute distress.  Skin: Skin is warm and dry. No rash noted.   Cardiovascular: Normal heart rate noted  Respiratory: Normal respiratory effort, no problems with respiration noted  Abdomen: Soft, gravid, appropriate for gestational age. Pain/Pressure: Absent     Pelvic:  Cervical exam deferred        Extremities: Normal range of motion.      ental Status: Normal mood and affect. Normal behavior. Normal judgment and thought content.     Assessment   40y.o. G2P0010 at 148w6dy  03/03/2021, by Last Menstrual Period presenting for routine prenatal visit  Plan   pregnancy2 Problems (from 05/27/20 to present)    Problem Noted Resolved   Supervision of normal pregnancy 07/17/2020 by VeOrlie PollenCNM No   Overview Addendum 09/08/2020  8:15 AM by VeOrlie PollenCNM     Nursing Staff Provider  Office Location  [facility] Dating   LMP = 7w US[place]Language  English Anatomy [place] ordered  Flu Vaccine   allergic Genetic Screen  NIPS:  Neg x3, XX  TDaP vaccine    Hgb A1C or  GTT Hgb A1C: 5.4 Third trimester :   Rhogam   n/a   LAB RESULTS   Feeding Plan  breast Blood Type O/Positive/-- (12/09 1525)   Contraception  Antibody  negative  Circumcision  Rubella  immune  Pediatrician   RPR  non-reactive  Support Person  Will HBsAg  negative  Prenatal Classes  provided resources at [facility] HIV  non-reactive   COVID  vaccinated Varicella  immune  BTL Consent  n/a GBS  (For PCN allergy, check sensitivities)        VBAC Consent  n/a Pap  UTD - 7/21 - NILM    Hgb Electro   A2 borderline elevated - screening for beta thal minor by heme    CF  negative     SMA  negative              Previous Version      -Reviewed process for anatomy scan at next visit -Discussed  HA treatment and red flag symptoms in pregnancy  Second trimester obstetric precautions including but not limited to vaginal bleeding, contractions, leaking of fluid and fetal movement were reviewed in detail with the patient.    Return in about 1 month (around 10/09/2020) for [place] (review anat scan).  Orlie Pollen, CNM, MSN [facility], [facility] 09/08/2020, 8:34 AM

## 2020-09-15 ENCOUNTER — Other Ambulatory Visit: Payer: Self-pay

## 2020-09-15 ENCOUNTER — Ambulatory Visit: Payer: 59 | Admitting: Dermatology

## 2020-09-15 ENCOUNTER — Encounter: Payer: Self-pay | Admitting: Dermatology

## 2020-09-15 DIAGNOSIS — L308 Other specified dermatitis: Secondary | ICD-10-CM | POA: Diagnosis not present

## 2020-09-15 DIAGNOSIS — L299 Pruritus, unspecified: Secondary | ICD-10-CM | POA: Diagnosis not present

## 2020-09-15 DIAGNOSIS — R21 Rash and other nonspecific skin eruption: Secondary | ICD-10-CM

## 2020-09-15 MED ORDER — MUPIROCIN 2 % EX OINT
TOPICAL_OINTMENT | CUTANEOUS | 0 refills | Status: DC
  Filled 2020-09-15: qty 22, 7d supply, fill #0

## 2020-09-15 NOTE — Patient Instructions (Signed)

## 2020-09-15 NOTE — Progress Notes (Signed)
Follow-Up Visit   Subjective  Angie Tucker is a 40 y.o. female who presents for the following: Rash (Of the R forearm and back - still itchy. Patient states that she uses TMC 0.1% ointment QD on the weekends. ).  The following portions of the chart were reviewed this encounter and updated as appropriate:   Tobacco  Allergies  Meds  Problems  Med Hx  Surg Hx  Fam Hx      Review of Systems:  No other skin or systemic complaints except as noted in HPI or Assessment and Plan.  Objective  Well appearing patient in no apparent distress; mood and affect are within normal limits.  A focused examination was performed including the arms and back. Relevant physical exam findings are noted in the Assessment and Plan.  Objective  R forearm, back: brown to erythematous papules coalescing to annular plaques   Assessment & Plan  Rash R forearm, back  Possible lupus, scardosis, granuloma annulare, pityriasis rosea - recent ANA, ACE WNL   Improvement in itch, slight improvement in rash with topical steroid.  Discussed and recommend punch bx today for more definitive answer and treatment.  Continue TMC 0.1% ointment to aa's QD on the weekends. Topical steroids (such as triamcinolone, fluocinolone, fluocinonide, mometasone, clobetasol, halobetasol, betamethasone, hydrocortisone) can cause thinning and lightening of the skin if they are used for too long in the same area. Your physician has selected the right strength medicine for your problem and area affected on the body. Please use your medication only as directed by your physician to prevent side effects. '  Start Mupirocin 2% ointment to bx site QD until return appointment in 2 weeks.  Skin / nail biopsy - R forearm, back Type of biopsy: punch   Informed consent: discussed and consent obtained   Timeout: patient name, date of birth, surgical site, and procedure verified   Procedure prep:  Patient was prepped and draped in usual  sterile fashion (the patient was cleaned and prepped) Prep type:  Isopropyl alcohol Anesthesia: the lesion was anesthetized in a standard fashion   Anesthetic:  1% lidocaine w/ epinephrine 1-100,000 buffered w/ 8.4% NaHCO3 Punch size:  4 mm Suture size:  4-0 Suture type: Prolene (polypropylene)   Suture type comment:  4.0 Hemostasis achieved with: suture, pressure and aluminum chloride   Outcome: patient tolerated procedure well   Post-procedure details: sterile dressing applied and wound care instructions given   Dressing type: bandage, petrolatum and pressure dressing    mupirocin ointment (BACTROBAN) 2 % - R forearm, back  Specimen 1 - Surgical pathology Differential Diagnosis: D48.5, R21 Check Margins: No R/o possible lupus, scardosis, granuloma annulare, pityriasis rosea  Pruritus Right Forearm - Anterior  Continue triamcinolone daily on weekends as needed to areas of rash. Avoid applying to face, groin, and axilla. Use as directed. Risk of skin atrophy with long-term use reviewed.   Topical steroids (such as triamcinolone, fluocinolone, fluocinonide, mometasone, clobetasol, halobetasol, betamethasone, hydrocortisone) can cause thinning and lightening of the skin if they are used for too long in the same area. Your physician has selected the right strength medicine for your problem and area affected on the body. Please use your medication only as directed by your physician to prevent side effects.    Return in about 2 weeks (around 09/29/2020) for suture removal and to discuss tx options.  Maylene Roes, CMA, am acting as scribe for Darden Dates, MD .  Documentation: I have reviewed the above  documentation for accuracy and completeness, and I agree with the above.  Darden Dates, MD

## 2020-09-23 ENCOUNTER — Telehealth: Payer: Self-pay

## 2020-09-23 DIAGNOSIS — Z361 Encounter for antenatal screening for raised alphafetoprotein level: Secondary | ICD-10-CM | POA: Diagnosis not present

## 2020-09-23 DIAGNOSIS — Z3A17 17 weeks gestation of pregnancy: Secondary | ICD-10-CM | POA: Diagnosis not present

## 2020-09-23 DIAGNOSIS — O09522 Supervision of elderly multigravida, second trimester: Secondary | ICD-10-CM | POA: Diagnosis not present

## 2020-09-23 NOTE — Telephone Encounter (Signed)
Patient called and was notified of results and recommendations. She states she will continue with triamcinolone as recommended and follow gentle skin care guide. She denied further questions at this time.

## 2020-09-23 NOTE — Telephone Encounter (Signed)
-----   Message from [place], MD sent at 09/23/2020 11:41 AM EDT ----- Skin , R forearm, back PSORIASIFORM SPONGIOTIC DERMATITIS, SEE DESCRIPTION "findings most suggestive of an eczema which can include allergies to something touching her skin. NO evidence of sarcoidosis or autoimmune disease on biopsy"  Microscopic Description There is psoriasiform hyperplasia of the epidermis with foci of slight spongiosis and parakeratosis. An infiltrate composed predominantly of lymphocytes is present around the superficial vascular plexus. Following review of the hematoxylin and eosin sections, a PAS stain was obtained to exclude a fungal infection. The PAS stain is negative for fungal organisms. The findings are most consistent with a subacute eczematous dermatitis such as contact, nummular or atopic dermatitis. Multiple sections have been examined.  Given pregnancy, if she is tolerating symptoms and rash with the triamcinolone, would stay with triamcinolone and use twice a day as needed when rash is more bothersome. There are other medicines that would help rash more but are less preferred in pregnancy due to potential risks.  Would recommend gentle skin care with free and clear products and hydrating products.  MAs please call. Thank you!

## 2020-09-24 ENCOUNTER — Encounter: Payer: Self-pay | Admitting: Certified Nurse Midwife

## 2020-09-30 ENCOUNTER — Ambulatory Visit: Payer: 59 | Admitting: Dermatology

## 2020-10-08 ENCOUNTER — Other Ambulatory Visit: Payer: Self-pay

## 2020-10-08 ENCOUNTER — Ambulatory Visit: Payer: 59

## 2020-10-08 DIAGNOSIS — Z3A19 19 weeks gestation of pregnancy: Secondary | ICD-10-CM | POA: Diagnosis not present

## 2020-10-08 DIAGNOSIS — Z363 Encounter for antenatal screening for malformations: Secondary | ICD-10-CM | POA: Diagnosis not present

## 2020-10-08 DIAGNOSIS — O09522 Supervision of elderly multigravida, second trimester: Secondary | ICD-10-CM | POA: Diagnosis not present

## 2020-10-08 MED FILL — Valacyclovir HCl Tab 1 GM: ORAL | 90 days supply | Qty: 90 | Fill #0 | Status: AC

## 2020-10-09 ENCOUNTER — Encounter: Payer: 59 | Admitting: Obstetrics and Gynecology

## 2020-10-20 ENCOUNTER — Other Ambulatory Visit: Payer: Self-pay

## 2020-10-20 MED FILL — Sertraline HCl Tab 100 MG: ORAL | 90 days supply | Qty: 90 | Fill #0 | Status: AC

## 2020-12-09 DIAGNOSIS — O4443 Low lying placenta NOS or without hemorrhage, third trimester: Secondary | ICD-10-CM | POA: Diagnosis not present

## 2020-12-09 DIAGNOSIS — Z3689 Encounter for other specified antenatal screening: Secondary | ICD-10-CM | POA: Diagnosis not present

## 2020-12-09 DIAGNOSIS — O09522 Supervision of elderly multigravida, second trimester: Secondary | ICD-10-CM | POA: Diagnosis not present

## 2020-12-09 DIAGNOSIS — Z3A28 28 weeks gestation of pregnancy: Secondary | ICD-10-CM | POA: Diagnosis not present

## 2020-12-15 DIAGNOSIS — R609 Edema, unspecified: Secondary | ICD-10-CM | POA: Diagnosis not present

## 2020-12-16 ENCOUNTER — Ambulatory Visit: Payer: 59 | Admitting: Dermatology

## 2021-01-06 DIAGNOSIS — O09523 Supervision of elderly multigravida, third trimester: Secondary | ICD-10-CM | POA: Diagnosis not present

## 2021-01-06 DIAGNOSIS — O4443 Low lying placenta NOS or without hemorrhage, third trimester: Secondary | ICD-10-CM | POA: Diagnosis not present

## 2021-01-06 DIAGNOSIS — Z3A32 32 weeks gestation of pregnancy: Secondary | ICD-10-CM | POA: Diagnosis not present

## 2021-01-11 ENCOUNTER — Other Ambulatory Visit: Payer: Self-pay

## 2021-01-11 MED FILL — Valacyclovir HCl Tab 1 GM: ORAL | 90 days supply | Qty: 90 | Fill #1 | Status: AC

## 2021-01-18 DIAGNOSIS — Z3483 Encounter for supervision of other normal pregnancy, third trimester: Secondary | ICD-10-CM | POA: Diagnosis not present

## 2021-01-18 DIAGNOSIS — Z3482 Encounter for supervision of other normal pregnancy, second trimester: Secondary | ICD-10-CM | POA: Diagnosis not present

## 2021-01-19 DIAGNOSIS — O4443 Low lying placenta NOS or without hemorrhage, third trimester: Secondary | ICD-10-CM | POA: Diagnosis not present

## 2021-01-19 DIAGNOSIS — O09523 Supervision of elderly multigravida, third trimester: Secondary | ICD-10-CM | POA: Diagnosis not present

## 2021-01-19 DIAGNOSIS — Z3A33 33 weeks gestation of pregnancy: Secondary | ICD-10-CM | POA: Diagnosis not present

## 2021-01-19 DIAGNOSIS — R030 Elevated blood-pressure reading, without diagnosis of hypertension: Secondary | ICD-10-CM | POA: Diagnosis not present

## 2021-01-20 DIAGNOSIS — Z3A34 34 weeks gestation of pregnancy: Secondary | ICD-10-CM | POA: Diagnosis not present

## 2021-01-20 DIAGNOSIS — R030 Elevated blood-pressure reading, without diagnosis of hypertension: Secondary | ICD-10-CM | POA: Diagnosis not present

## 2021-01-20 DIAGNOSIS — O4443 Low lying placenta NOS or without hemorrhage, third trimester: Secondary | ICD-10-CM | POA: Diagnosis not present

## 2021-01-20 DIAGNOSIS — O09523 Supervision of elderly multigravida, third trimester: Secondary | ICD-10-CM | POA: Diagnosis not present

## 2021-01-28 DIAGNOSIS — Z3A35 35 weeks gestation of pregnancy: Secondary | ICD-10-CM | POA: Diagnosis not present

## 2021-01-28 DIAGNOSIS — Z7689 Persons encountering health services in other specified circumstances: Secondary | ICD-10-CM | POA: Diagnosis not present

## 2021-01-28 DIAGNOSIS — O09523 Supervision of elderly multigravida, third trimester: Secondary | ICD-10-CM | POA: Diagnosis not present

## 2021-01-28 DIAGNOSIS — Z3685 Encounter for antenatal screening for Streptococcus B: Secondary | ICD-10-CM | POA: Diagnosis not present

## 2021-01-28 DIAGNOSIS — Z23 Encounter for immunization: Secondary | ICD-10-CM | POA: Diagnosis not present

## 2021-01-28 DIAGNOSIS — O4443 Low lying placenta NOS or without hemorrhage, third trimester: Secondary | ICD-10-CM | POA: Diagnosis not present

## 2021-02-01 DIAGNOSIS — O09523 Supervision of elderly multigravida, third trimester: Secondary | ICD-10-CM | POA: Diagnosis not present

## 2021-02-01 DIAGNOSIS — Z3A35 35 weeks gestation of pregnancy: Secondary | ICD-10-CM | POA: Diagnosis not present

## 2021-02-09 DIAGNOSIS — O4443 Low lying placenta NOS or without hemorrhage, third trimester: Secondary | ICD-10-CM | POA: Diagnosis not present

## 2021-02-09 DIAGNOSIS — Z3A36 36 weeks gestation of pregnancy: Secondary | ICD-10-CM | POA: Diagnosis not present

## 2021-02-09 DIAGNOSIS — O09523 Supervision of elderly multigravida, third trimester: Secondary | ICD-10-CM | POA: Diagnosis not present

## 2021-02-17 ENCOUNTER — Telehealth (HOSPITAL_COMMUNITY): Payer: Self-pay | Admitting: *Deleted

## 2021-02-17 DIAGNOSIS — Z3A38 38 weeks gestation of pregnancy: Secondary | ICD-10-CM | POA: Diagnosis not present

## 2021-02-17 DIAGNOSIS — O09523 Supervision of elderly multigravida, third trimester: Secondary | ICD-10-CM | POA: Diagnosis not present

## 2021-02-17 DIAGNOSIS — O4443 Low lying placenta NOS or without hemorrhage, third trimester: Secondary | ICD-10-CM | POA: Diagnosis not present

## 2021-02-17 DIAGNOSIS — R030 Elevated blood-pressure reading, without diagnosis of hypertension: Secondary | ICD-10-CM | POA: Diagnosis not present

## 2021-02-17 IMAGING — MG DIGITAL DIAGNOSTIC BILAT W/ TOMO W/ CAD
5 of 8 series · 5 of 24 positions shown · non-contrast
Comparison: Previous exam(s).
COMPARISON: Previous exam(s).

Addendum:
CLINICAL DATA: 39-year-old female presenting for annual exam as
well as follow-up of probably benign right breast masses.

EXAM:
DIGITAL DIAGNOSTIC BILATERAL MAMMOGRAM WITH TOMOSYNTHESIS AND CAD;
ULTRASOUND RIGHT BREAST LIMITED
TECHNIQUE: Bilateral digital diagnostic mammography and breast tomosynthesis
was performed. The images were evaluated with computer-aided
detection.; Targeted ultrasound examination of the right breast was
performed

[R MLO synth-2D]
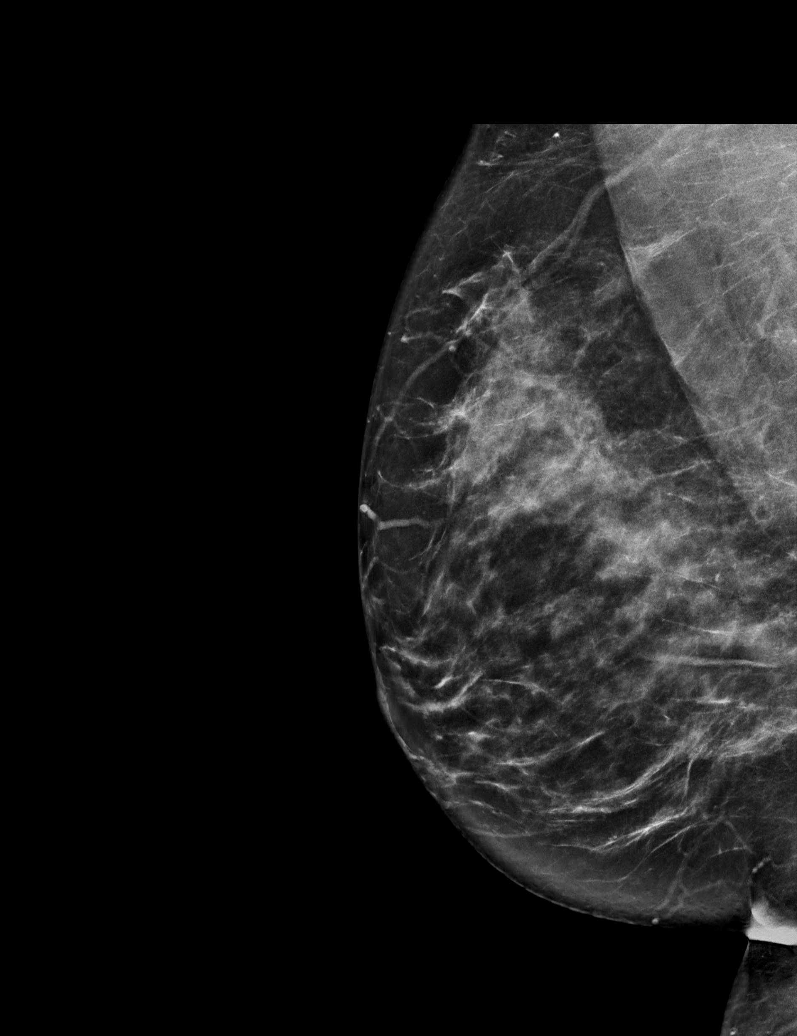

[R CC synth-2D]
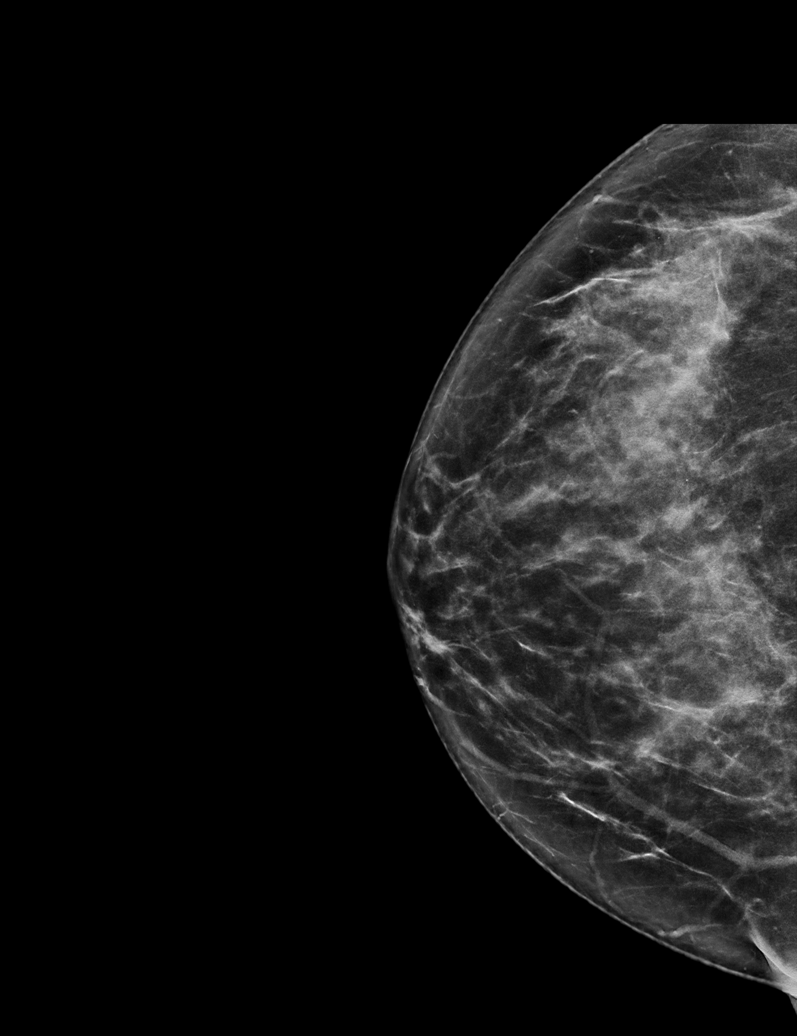

[L MLO synth-2D]
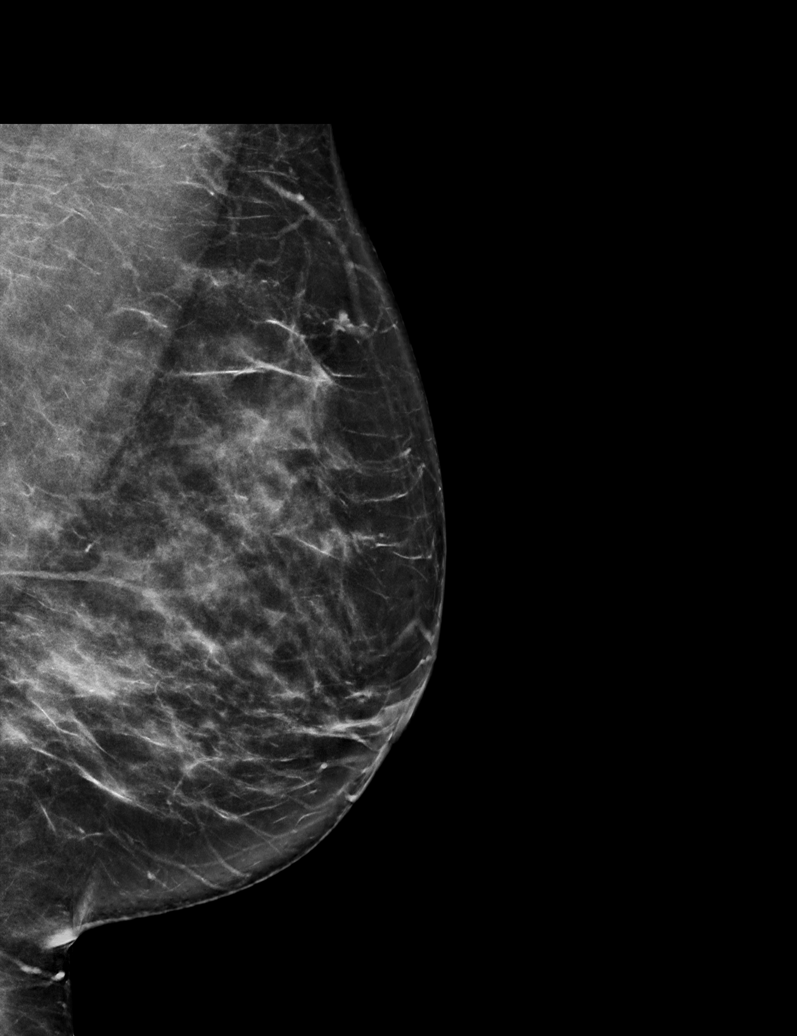

[L CC synth-2D]
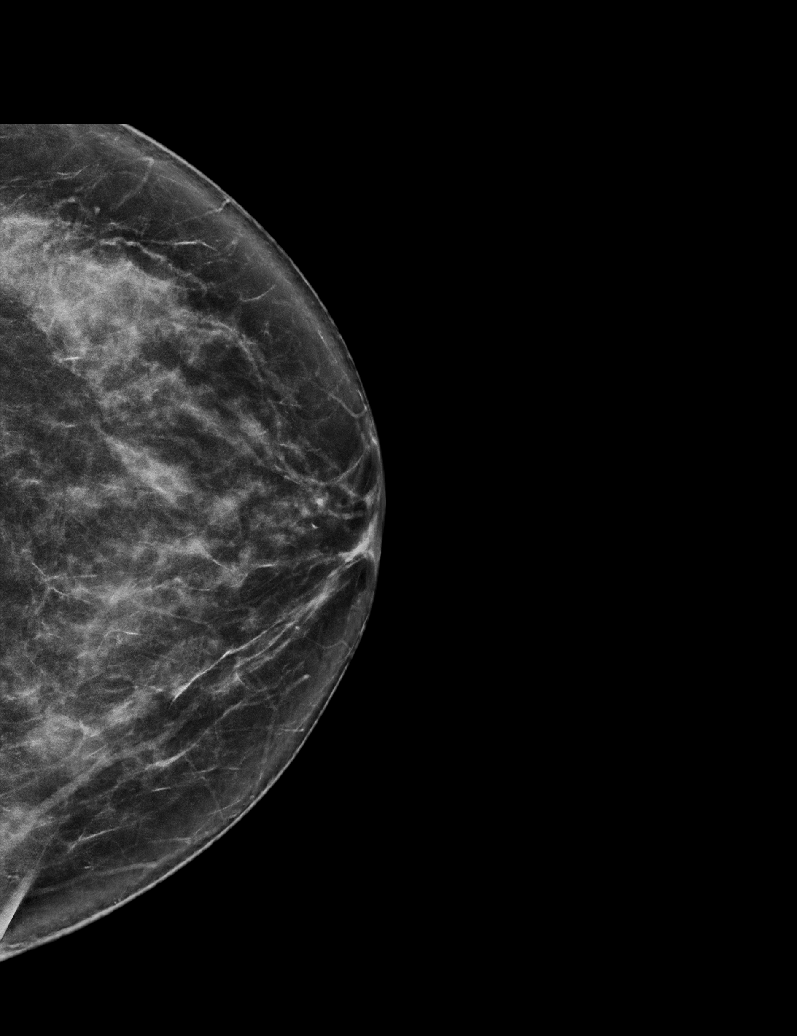

[L CC tomo · tomo slice 34/67.0]
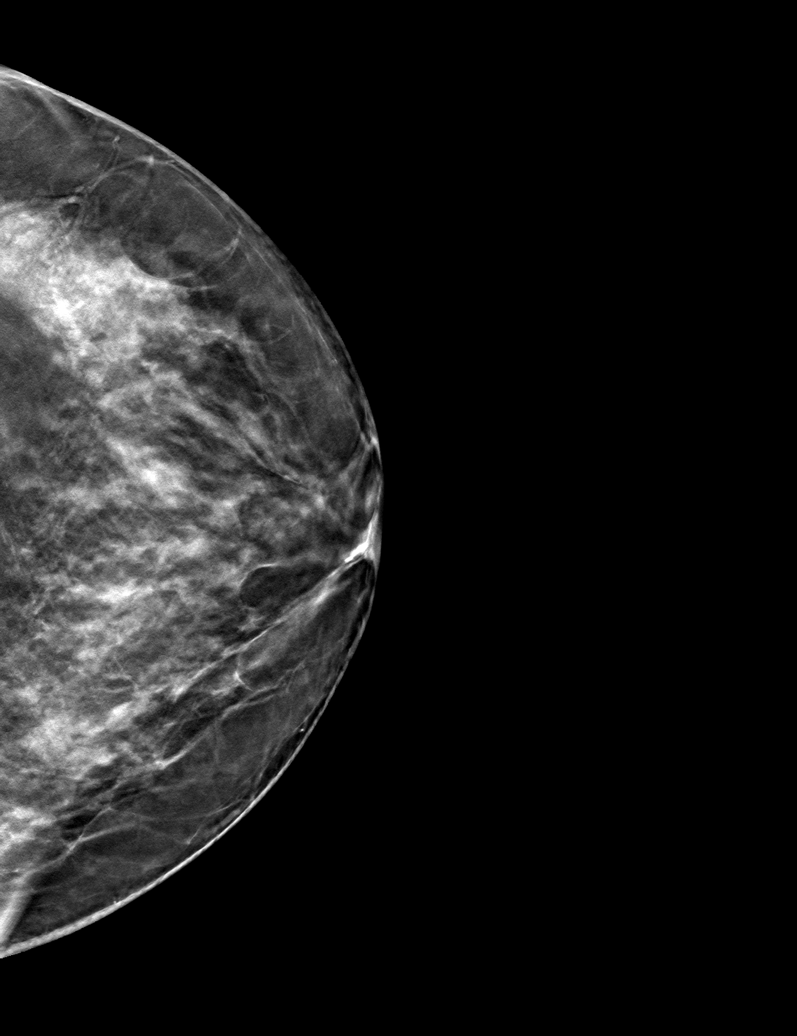

[5 of 24 positions shown; findings below may reference images not displayed]

ACR Breast Density Category c: The breast tissue is heterogeneously
dense, which may obscure small masses.
FINDINGS: Mammogram:

Right breast: Stable mammographic appearance of the right breast. No
new suspicious mass, distortion, or microcalcifications are
identified to suggest presence of malignancy.

Left breast: No suspicious mass, distortion, or microcalcifications
are identified to suggest presence of malignancy.

Ultrasound:

Targeted ultrasound is performed in the right breast at 10 o'clock 8
cm from nipple demonstrating an oval circumscribed hypoechoic mass
measuring 0.9 x 0.3 x 0.5 cm, previously measuring 0.9 x 0.3 x
cm.

Targeted ultrasound is performed in the right breast at 10 o'clock 6
cm from nipple demonstrating a small oval circumscribed hypoechoic
mass measuring 0.3 x 0.1 x 0.3 cm, previously measuring 0.4 x 0.3 x
0.5 cm.

Targeted ultrasound is performed in the right breast at 11 o'clock 6
cm from nipple demonstrating an oval circumscribed hypoechoic mass
measuring 0.8 x 0.3 x 0.4 cm, previously measuring 0.8 x 0.3 x
cm.

Targeted ultrasound performed in the right breast at 12 o'clock 6 cm
from nipple demonstrates resolution of the previously seen mass in
this location which may have represented a cyst.
IMPRESSION: 1. Stable probably benign masses in the right breast at 10 o'clock 8
cm from nipple and at 11 o'clock 6 cm from the nipple. Interval
decrease in size of a probably benign mass in the right breast at 10
o'clock 6 cm from the nipple. The previously seen mass in the right
breast at 12 o'clock is no longer visualized.

2.  No mammographic evidence of malignancy in the left breast.

RECOMMENDATION:
Diagnostic bilateral mammogram and right breast ultrasound to
complete 2 year follow-up of probably benign right breast masses and
for annual exam of the left breast.

I have discussed the findings and recommendations with the patient.
If applicable, a reminder letter will be sent to the patient
regarding the next appointment.

BI-RADS CATEGORY  3: Probably benign.

ADDENDUM:
Recommendation: Diagnostic bilateral mammogram and right breast
ultrasound in 1 year to complete 2 year follow-up of the probably
benign right breast masses and for annual exam of the left breast.

*** End of Addendum ***
ACR Breast Density Category c: The breast tissue is heterogeneously
dense, which may obscure small masses.
FINDINGS: Mammogram:

Right breast: Stable mammographic appearance of the right breast. No
new suspicious mass, distortion, or microcalcifications are
identified to suggest presence of malignancy.

Left breast: No suspicious mass, distortion, or microcalcifications
are identified to suggest presence of malignancy.

Ultrasound:

Targeted ultrasound is performed in the right breast at 10 o'clock 8
cm from nipple demonstrating an oval circumscribed hypoechoic mass
measuring 0.9 x 0.3 x 0.5 cm, previously measuring 0.9 x 0.3 x
cm.

Targeted ultrasound is performed in the right breast at 10 o'clock 6
cm from nipple demonstrating a small oval circumscribed hypoechoic
mass measuring 0.3 x 0.1 x 0.3 cm, previously measuring 0.4 x 0.3 x
0.5 cm.

Targeted ultrasound is performed in the right breast at 11 o'clock 6
cm from nipple demonstrating an oval circumscribed hypoechoic mass
measuring 0.8 x 0.3 x 0.4 cm, previously measuring 0.8 x 0.3 x
cm.

Targeted ultrasound performed in the right breast at 12 o'clock 6 cm
from nipple demonstrates resolution of the previously seen mass in
this location which may have represented a cyst.
IMPRESSION: 1. Stable probably benign masses in the right breast at 10 o'clock 8
cm from nipple and at 11 o'clock 6 cm from the nipple. Interval
decrease in size of a probably benign mass in the right breast at 10
o'clock 6 cm from the nipple. The previously seen mass in the right
breast at 12 o'clock is no longer visualized.

2.  No mammographic evidence of malignancy in the left breast.

RECOMMENDATION:
Diagnostic bilateral mammogram and right breast ultrasound to
complete 2 year follow-up of probably benign right breast masses and
for annual exam of the left breast.

I have discussed the findings and recommendations with the patient.
If applicable, a reminder letter will be sent to the patient
regarding the next appointment.

BI-RADS CATEGORY  3: Probably benign.

## 2021-02-17 NOTE — Telephone Encounter (Signed)
Preadmission screen  

## 2021-02-17 NOTE — Patient Instructions (Addendum)
Angie Tucker  02/17/2021   Your procedure is scheduled on:  02/24/2021  Arrive at 2:00 PM at Entrance C on [address] at [facility]  and [employer]. You are invited to use the FREE valet parking or use the Visitor's parking deck.  Pick up the phone at the desk and dial [phone].  Call this number if you have problems the morning of surgery: [phone]  Remember:   Do not eat food:(After Midnight) Desps de medianoche.  Do not drink clear liquids: (6 Hours before arrival) 6 horas ante llegada.  Take these medicines the morning of surgery with A SIP OF WATER:  Take zoloft as prescribed   Do not wear jewelry, make-up or nail polish.  Do not wear lotions, powders, or perfumes. Do not wear deodorant.  Do not shave 48 hours prior to surgery.  Do not bring valuables to the hospital.  [facility] is not   responsible for any belongings or valuables brought to the hospital.  Contacts, dentures or bridgework may not be worn into surgery.  Leave suitcase in the car. After surgery it may be brought to your room.  For patients admitted to the hospital, checkout time is 11:00 AM the day of              discharge.      Please read over the following fact sheets that you were given:     Preparing for Surgery

## 2021-02-18 ENCOUNTER — Telehealth (HOSPITAL_COMMUNITY): Payer: Self-pay | Admitting: *Deleted

## 2021-02-18 ENCOUNTER — Encounter (HOSPITAL_COMMUNITY): Payer: Self-pay

## 2021-02-18 DIAGNOSIS — O09523 Supervision of elderly multigravida, third trimester: Secondary | ICD-10-CM | POA: Diagnosis not present

## 2021-02-18 DIAGNOSIS — Z3A38 38 weeks gestation of pregnancy: Secondary | ICD-10-CM | POA: Diagnosis not present

## 2021-02-18 DIAGNOSIS — O4443 Low lying placenta NOS or without hemorrhage, third trimester: Secondary | ICD-10-CM | POA: Diagnosis not present

## 2021-02-18 NOTE — Telephone Encounter (Signed)
Preadmission screen  

## 2021-02-21 ENCOUNTER — Inpatient Hospital Stay (HOSPITAL_COMMUNITY): Payer: 59 | Admitting: Anesthesiology

## 2021-02-21 ENCOUNTER — Other Ambulatory Visit: Payer: Self-pay

## 2021-02-21 ENCOUNTER — Encounter (HOSPITAL_COMMUNITY)
Admission: AD | Disposition: A | Discharge status: Home / Self Care | Payer: Self-pay | Source: Home / Self Care | Attending: Obstetrics and Gynecology

## 2021-02-21 ENCOUNTER — Encounter (HOSPITAL_COMMUNITY): Payer: Self-pay | Admitting: Obstetrics and Gynecology

## 2021-02-21 ENCOUNTER — Inpatient Hospital Stay (HOSPITAL_COMMUNITY)
Admission: AD | Admit: 2021-02-21 | Discharge: 2021-02-23 | DRG: 787 | Disposition: A | Discharge status: Home / Self Care | Payer: 59 | Attending: Obstetrics and Gynecology | Admitting: Obstetrics and Gynecology

## 2021-02-21 ENCOUNTER — Inpatient Hospital Stay (HOSPITAL_COMMUNITY): Admission: AD | Admit: 2021-02-21 | Payer: 59 | Source: Home / Self Care | Admitting: Obstetrics and Gynecology

## 2021-02-21 DIAGNOSIS — F419 Anxiety disorder, unspecified: Secondary | ICD-10-CM | POA: Diagnosis present

## 2021-02-21 DIAGNOSIS — Z20822 Contact with and (suspected) exposure to covid-19: Secondary | ICD-10-CM | POA: Diagnosis present

## 2021-02-21 DIAGNOSIS — O99892 Other specified diseases and conditions complicating childbirth: Secondary | ICD-10-CM | POA: Diagnosis present

## 2021-02-21 DIAGNOSIS — O169 Unspecified maternal hypertension, unspecified trimester: Secondary | ICD-10-CM | POA: Diagnosis present

## 2021-02-21 DIAGNOSIS — D62 Acute posthemorrhagic anemia: Secondary | ICD-10-CM | POA: Diagnosis not present

## 2021-02-21 DIAGNOSIS — O9081 Anemia of the puerperium: Secondary | ICD-10-CM | POA: Diagnosis not present

## 2021-02-21 DIAGNOSIS — O99344 Other mental disorders complicating childbirth: Secondary | ICD-10-CM | POA: Diagnosis not present

## 2021-02-21 DIAGNOSIS — R011 Cardiac murmur, unspecified: Secondary | ICD-10-CM | POA: Diagnosis present

## 2021-02-21 DIAGNOSIS — F32A Depression, unspecified: Secondary | ICD-10-CM | POA: Diagnosis present

## 2021-02-21 DIAGNOSIS — O99824 Streptococcus B carrier state complicating childbirth: Secondary | ICD-10-CM | POA: Diagnosis present

## 2021-02-21 DIAGNOSIS — O99214 Obesity complicating childbirth: Secondary | ICD-10-CM | POA: Diagnosis present

## 2021-02-21 DIAGNOSIS — Z3A38 38 weeks gestation of pregnancy: Secondary | ICD-10-CM

## 2021-02-21 DIAGNOSIS — O9902 Anemia complicating childbirth: Secondary | ICD-10-CM | POA: Diagnosis not present

## 2021-02-21 DIAGNOSIS — D649 Anemia, unspecified: Secondary | ICD-10-CM | POA: Diagnosis not present

## 2021-02-21 DIAGNOSIS — O134 Gestational [pregnancy-induced] hypertension without significant proteinuria, complicating childbirth: Principal | ICD-10-CM | POA: Diagnosis present

## 2021-02-21 DIAGNOSIS — O321XX0 Maternal care for breech presentation, not applicable or unspecified: Secondary | ICD-10-CM | POA: Diagnosis present

## 2021-02-21 LAB — URINALYSIS, ROUTINE W REFLEX MICROSCOPIC
Bilirubin Urine: NEGATIVE
Glucose, UA: NEGATIVE mg/dL
Hgb urine dipstick: NEGATIVE
Ketones, ur: NEGATIVE mg/dL
Nitrite: NEGATIVE
Protein, ur: 30 mg/dL — AB
Specific Gravity, Urine: 1.028 (ref 1.005–1.030)
pH: 5 (ref 5.0–8.0)

## 2021-02-21 LAB — COMPREHENSIVE METABOLIC PANEL
ALT: 17 U/L (ref 0–44)
AST: 22 U/L (ref 15–41)
Albumin: 2.4 g/dL — ABNORMAL LOW (ref 3.5–5.0)
Alkaline Phosphatase: 107 U/L (ref 38–126)
Anion gap: 7 (ref 5–15)
BUN: 7 mg/dL (ref 6–20)
CO2: 20 mmol/L — ABNORMAL LOW (ref 22–32)
Calcium: 8.7 mg/dL — ABNORMAL LOW (ref 8.9–10.3)
Chloride: 108 mmol/L (ref 98–111)
Creatinine, Ser: 0.77 mg/dL (ref 0.44–1.00)
GFR, Estimated: >60 mL/min (ref >60)
Glucose, Bld: 81 mg/dL (ref 70–99)
Potassium: 3.9 mmol/L (ref 3.5–5.1)
Sodium: 135 mmol/L (ref 135–145)
Total Bilirubin: 0.3 mg/dL (ref 0.3–1.2)
Total Protein: 6.2 g/dL — ABNORMAL LOW (ref 6.5–8.1)

## 2021-02-21 LAB — CBC
HCT: 33.4 % — ABNORMAL LOW (ref 36.0–46.0)
Hemoglobin: 10.8 g/dL — ABNORMAL LOW (ref 12.0–15.0)
MCH: 27.9 pg (ref 26.0–34.0)
MCHC: 32.3 g/dL (ref 30.0–36.0)
MCV: 86.3 fL (ref 80.0–100.0)
Platelets: 270 10*3/uL (ref 150–400)
RBC: 3.87 MIL/uL (ref 3.87–5.11)
RDW: 13.4 % (ref 11.5–15.5)
WBC: 8 10*3/uL (ref 4.0–10.5)
nRBC: 0 % (ref 0.0–0.2)

## 2021-02-21 LAB — ABO/RH: ABO/RH(D): O POS

## 2021-02-21 LAB — TYPE AND SCREEN
ABO/RH(D): O POS
Antibody Screen: NEGATIVE

## 2021-02-21 LAB — RESP PANEL BY RT-PCR (FLU A&B, COVID) ARPGX2
Influenza A by PCR: NEGATIVE
Influenza B by PCR: NEGATIVE
SARS Coronavirus 2 by RT PCR: NEGATIVE

## 2021-02-21 LAB — RPR: RPR Ser Ql: NONREACTIVE

## 2021-02-21 LAB — PROTEIN / CREATININE RATIO, URINE
Creatinine, Urine: 314.82 mg/dL
Protein Creatinine Ratio: 0.2 mg/mg{Cre} — ABNORMAL HIGH (ref 0.00–0.15)
Total Protein, Urine: 64 mg/dL

## 2021-02-21 SURGERY — Surgical Case
Anesthesia: Spinal

## 2021-02-21 MED ORDER — FENTANYL CITRATE (PF) 100 MCG/2ML IJ SOLN
INTRAMUSCULAR | Status: AC
  Filled 2021-02-21: qty 2

## 2021-02-21 MED ORDER — SIMETHICONE 80 MG PO CHEW
80.0000 mg | CHEWABLE_TABLET | Freq: Three times a day (TID) | ORAL | Status: DC
  Administered 2021-02-21 – 2021-02-23 (×5): 80 mg via ORAL
  Filled 2021-02-21 (×6): qty 1

## 2021-02-21 MED ORDER — TETANUS-DIPHTH-ACELL PERTUSSIS 5-2.5-18.5 LF-MCG/0.5 IM SUSY: 0.5000 mL | PREFILLED_SYRINGE | Freq: Once | INTRAMUSCULAR | Status: DC

## 2021-02-21 MED ORDER — METHYLERGONOVINE MALEATE 0.2 MG PO TABS: 0.2000 mg | ORAL_TABLET | ORAL | Status: DC | PRN

## 2021-02-21 MED ORDER — BUPIVACAINE HCL (PF) 0.25 % IJ SOLN
INTRAMUSCULAR | Status: DC | PRN
  Administered 2021-02-21: 20 mL

## 2021-02-21 MED ORDER — NIFEDIPINE ER OSMOTIC RELEASE 30 MG PO TB24
30.0000 mg | ORAL_TABLET | Freq: Every day | ORAL | Status: DC
  Administered 2021-02-21 – 2021-02-23 (×3): 30 mg via ORAL
  Filled 2021-02-21 (×3): qty 1

## 2021-02-21 MED ORDER — SIMETHICONE 80 MG PO CHEW: 80.0000 mg | CHEWABLE_TABLET | ORAL | Status: DC | PRN

## 2021-02-21 MED ORDER — NALBUPHINE HCL 10 MG/ML IJ SOLN: 5.0000 mg | INTRAMUSCULAR | Status: DC | PRN

## 2021-02-21 MED ORDER — ACETAMINOPHEN 10 MG/ML IV SOLN: 1000.0000 mg | Freq: Once | INTRAVENOUS | Status: DC | PRN

## 2021-02-21 MED ORDER — DIBUCAINE (PERIANAL) 1 % EX OINT: 1.0000 "application " | TOPICAL_OINTMENT | CUTANEOUS | Status: DC | PRN

## 2021-02-21 MED ORDER — DEXAMETHASONE SODIUM PHOSPHATE 10 MG/ML IJ SOLN
INTRAMUSCULAR | Status: AC
  Filled 2021-02-21: qty 1

## 2021-02-21 MED ORDER — WITCH HAZEL-GLYCERIN EX PADS: 1.0000 "application " | MEDICATED_PAD | CUTANEOUS | Status: DC | PRN

## 2021-02-21 MED ORDER — CEFAZOLIN SODIUM-DEXTROSE 2-3 GM-%(50ML) IV SOLR
INTRAVENOUS | Status: DC | PRN
  Administered 2021-02-21: 2 g via INTRAVENOUS

## 2021-02-21 MED ORDER — STERILE WATER FOR IRRIGATION IR SOLN
Status: DC | PRN
  Administered 2021-02-21: 1

## 2021-02-21 MED ORDER — LABETALOL HCL 5 MG/ML IV SOLN: 80.0000 mg | INTRAVENOUS | Status: DC | PRN

## 2021-02-21 MED ORDER — METHYLERGONOVINE MALEATE 0.2 MG/ML IJ SOLN: 0.2000 mg | INTRAMUSCULAR | Status: DC | PRN

## 2021-02-21 MED ORDER — OXYCODONE HCL 5 MG/5ML PO SOLN: 5.0000 mg | Freq: Once | ORAL | Status: DC | PRN

## 2021-02-21 MED ORDER — NALOXONE HCL 0.4 MG/ML IJ SOLN: 0.4000 mg | INTRAMUSCULAR | Status: DC | PRN

## 2021-02-21 MED ORDER — HYDRALAZINE HCL 20 MG/ML IJ SOLN: 10.0000 mg | INTRAMUSCULAR | Status: DC | PRN

## 2021-02-21 MED ORDER — KETOROLAC TROMETHAMINE 30 MG/ML IJ SOLN
INTRAMUSCULAR | Status: AC
  Filled 2021-02-21: qty 1

## 2021-02-21 MED ORDER — POVIDONE-IODINE 10 % EX SWAB: 2.0000 "application " | Freq: Once | CUTANEOUS | Status: DC

## 2021-02-21 MED ORDER — COCONUT OIL OIL: 1.0000 "application " | TOPICAL_OIL | Status: DC | PRN

## 2021-02-21 MED ORDER — LABETALOL HCL 5 MG/ML IV SOLN: 40.0000 mg | INTRAVENOUS | Status: DC | PRN

## 2021-02-21 MED ORDER — NALOXONE HCL 4 MG/10ML IJ SOLN
1.0000 ug/kg/h | INTRAVENOUS | Status: DC | PRN
  Filled 2021-02-21: qty 5

## 2021-02-21 MED ORDER — SCOPOLAMINE 1 MG/3DAYS TD PT72
1.0000 | MEDICATED_PATCH | Freq: Once | TRANSDERMAL | Status: DC
  Administered 2021-02-21: 1.5 mg via TRANSDERMAL
  Filled 2021-02-21: qty 1

## 2021-02-21 MED ORDER — MENTHOL 3 MG MT LOZG: 1.0000 | LOZENGE | OROMUCOSAL | Status: DC | PRN

## 2021-02-21 MED ORDER — IBUPROFEN 600 MG PO TABS
600.0000 mg | ORAL_TABLET | Freq: Four times a day (QID) | ORAL | Status: DC
  Administered 2021-02-22 – 2021-02-23 (×5): 600 mg via ORAL
  Filled 2021-02-21 (×5): qty 1

## 2021-02-21 MED ORDER — ONDANSETRON HCL 4 MG/2ML IJ SOLN: 4.0000 mg | Freq: Three times a day (TID) | INTRAMUSCULAR | Status: DC | PRN

## 2021-02-21 MED ORDER — ONDANSETRON HCL 4 MG/2ML IJ SOLN
INTRAMUSCULAR | Status: DC | PRN
  Administered 2021-02-21: 4 mg via INTRAVENOUS

## 2021-02-21 MED ORDER — MEPERIDINE HCL 25 MG/ML IJ SOLN: 6.2500 mg | INTRAMUSCULAR | Status: DC | PRN

## 2021-02-21 MED ORDER — DIPHENHYDRAMINE HCL 50 MG/ML IJ SOLN
12.5000 mg | Freq: Four times a day (QID) | INTRAMUSCULAR | Status: DC | PRN
  Administered 2021-02-21: 12.5 mg via INTRAVENOUS
  Filled 2021-02-21: qty 1

## 2021-02-21 MED ORDER — COVID-19MRNA BIVAL VAC MODERNA 50 MCG/0.5ML IM SUSP
0.5000 mL | Freq: Once | INTRAMUSCULAR | Status: AC
  Administered 2021-02-23: 0.5 mL via INTRAMUSCULAR
  Filled 2021-02-21 (×2): qty 0.5

## 2021-02-21 MED ORDER — OXYCODONE HCL 5 MG PO TABS: 5.0000 mg | ORAL_TABLET | Freq: Once | ORAL | Status: DC | PRN

## 2021-02-21 MED ORDER — PROMETHAZINE HCL 25 MG/ML IJ SOLN: 6.2500 mg | INTRAMUSCULAR | Status: DC | PRN

## 2021-02-21 MED ORDER — ONDANSETRON HCL 4 MG/2ML IJ SOLN
INTRAMUSCULAR | Status: AC
  Filled 2021-02-21: qty 2

## 2021-02-21 MED ORDER — DEXAMETHASONE SODIUM PHOSPHATE 4 MG/ML IJ SOLN
INTRAMUSCULAR | Status: DC | PRN
  Administered 2021-02-21: 10 mg via INTRAVENOUS

## 2021-02-21 MED ORDER — FENTANYL CITRATE (PF) 100 MCG/2ML IJ SOLN
INTRAMUSCULAR | Status: DC | PRN
  Administered 2021-02-21: 15 ug via INTRATHECAL

## 2021-02-21 MED ORDER — SODIUM CHLORIDE 0.9 % IR SOLN
Status: DC | PRN
  Administered 2021-02-21: 1

## 2021-02-21 MED ORDER — FENTANYL CITRATE (PF) 100 MCG/2ML IJ SOLN: 25.0000 ug | INTRAMUSCULAR | Status: DC | PRN

## 2021-02-21 MED ORDER — MORPHINE SULFATE (PF) 0.5 MG/ML IJ SOLN
INTRAMUSCULAR | Status: AC
  Filled 2021-02-21: qty 10

## 2021-02-21 MED ORDER — SODIUM CHLORIDE 0.9% FLUSH: 3.0000 mL | INTRAVENOUS | Status: DC | PRN

## 2021-02-21 MED ORDER — KETOROLAC TROMETHAMINE 30 MG/ML IJ SOLN
30.0000 mg | Freq: Once | INTRAMUSCULAR | Status: AC
  Administered 2021-02-21: 30 mg via INTRAVENOUS

## 2021-02-21 MED ORDER — PHENYLEPHRINE HCL-NACL 20-0.9 MG/250ML-% IV SOLN
INTRAVENOUS | Status: DC | PRN
Start: 2021-02-21 — End: 2021-02-21
  Administered 2021-02-21: 60 ug/min via INTRAVENOUS

## 2021-02-21 MED ORDER — DIPHENHYDRAMINE HCL 25 MG PO CAPS: 25.0000 mg | ORAL_CAPSULE | Freq: Four times a day (QID) | ORAL | Status: DC | PRN

## 2021-02-21 MED ORDER — LABETALOL HCL 5 MG/ML IV SOLN
20.0000 mg | INTRAVENOUS | Status: DC | PRN
  Administered 2021-02-21: 20 mg via INTRAVENOUS
  Filled 2021-02-21: qty 4

## 2021-02-21 MED ORDER — SENNOSIDES-DOCUSATE SODIUM 8.6-50 MG PO TABS
2.0000 | ORAL_TABLET | Freq: Every day | ORAL | Status: DC
  Administered 2021-02-22 – 2021-02-23 (×2): 2 via ORAL
  Filled 2021-02-21 (×2): qty 2

## 2021-02-21 MED ORDER — OXYTOCIN-SODIUM CHLORIDE 30-0.9 UT/500ML-% IV SOLN
INTRAVENOUS | Status: AC
  Filled 2021-02-21: qty 500

## 2021-02-21 MED ORDER — CEFAZOLIN SODIUM-DEXTROSE 2-4 GM/100ML-% IV SOLN
INTRAVENOUS | Status: AC
  Filled 2021-02-21: qty 100

## 2021-02-21 MED ORDER — PRENATAL MULTIVITAMIN CH
1.0000 | ORAL_TABLET | Freq: Every day | ORAL | Status: DC
  Administered 2021-02-21 – 2021-02-23 (×3): 1 via ORAL
  Filled 2021-02-21 (×3): qty 1

## 2021-02-21 MED ORDER — OXYTOCIN-SODIUM CHLORIDE 30-0.9 UT/500ML-% IV SOLN
INTRAVENOUS | Status: DC | PRN
  Administered 2021-02-21: 30 [IU] via INTRAVENOUS

## 2021-02-21 MED ORDER — NALBUPHINE HCL 10 MG/ML IJ SOLN: 5.0000 mg | Freq: Once | INTRAMUSCULAR | Status: DC | PRN

## 2021-02-21 MED ORDER — MORPHINE SULFATE (PF) 0.5 MG/ML IJ SOLN
INTRAMUSCULAR | Status: DC | PRN
  Administered 2021-02-21: 150 ug via INTRATHECAL

## 2021-02-21 MED ORDER — OXYTOCIN-SODIUM CHLORIDE 30-0.9 UT/500ML-% IV SOLN: 2.5000 [IU]/h | INTRAVENOUS | Status: AC

## 2021-02-21 MED ORDER — ZOLPIDEM TARTRATE 5 MG PO TABS: 5.0000 mg | ORAL_TABLET | Freq: Every evening | ORAL | Status: DC | PRN

## 2021-02-21 MED ORDER — LACTATED RINGERS IV SOLN: INTRAVENOUS | Status: DC

## 2021-02-21 MED ORDER — KETOROLAC TROMETHAMINE 30 MG/ML IJ SOLN
30.0000 mg | Freq: Four times a day (QID) | INTRAMUSCULAR | Status: AC
  Administered 2021-02-21 – 2021-02-22 (×3): 30 mg via INTRAVENOUS
  Filled 2021-02-21 (×3): qty 1

## 2021-02-21 MED ORDER — OXYCODONE-ACETAMINOPHEN 5-325 MG PO TABS
1.0000 | ORAL_TABLET | ORAL | Status: DC | PRN
  Administered 2021-02-22 – 2021-02-23 (×6): 1 via ORAL
  Filled 2021-02-21 (×6): qty 1

## 2021-02-21 MED ORDER — DIPHENHYDRAMINE HCL 25 MG PO CAPS
25.0000 mg | ORAL_CAPSULE | ORAL | Status: DC | PRN
  Administered 2021-02-21 – 2021-02-22 (×3): 25 mg via ORAL
  Filled 2021-02-21 (×3): qty 1

## 2021-02-21 MED ORDER — BUPIVACAINE IN DEXTROSE 0.75-8.25 % IT SOLN
INTRATHECAL | Status: DC | PRN
  Administered 2021-02-21: 1.6 mL via INTRATHECAL

## 2021-02-21 SURGICAL SUPPLY — 34 items
BENZOIN TINCTURE PRP APPL 2/3 (GAUZE/BANDAGES/DRESSINGS) ×3 IMPLANT
CHLORAPREP W/TINT 26ML (MISCELLANEOUS) ×3 IMPLANT
CLAMP CORD UMBIL (MISCELLANEOUS) IMPLANT
CLOSURE STERI STRIP 1/2 X4 (GAUZE/BANDAGES/DRESSINGS) ×3 IMPLANT
CLOTH BEACON ORANGE TIMEOUT ST (SAFETY) ×3 IMPLANT
DRSG OPSITE POSTOP 4X10 (GAUZE/BANDAGES/DRESSINGS) ×3 IMPLANT
ELECT REM PT RETURN 9FT ADLT (ELECTROSURGICAL) ×3
ELECTRODE REM PT RTRN 9FT ADLT (ELECTROSURGICAL) ×1 IMPLANT
EXTRACTOR VACUUM KIWI (MISCELLANEOUS) IMPLANT
GLOVE BIO SURGEON STRL SZ7.5 (GLOVE) ×3 IMPLANT
GLOVE BIOGEL PI IND STRL 7.0 (GLOVE) ×1 IMPLANT
GLOVE BIOGEL PI INDICATOR 7.0 (GLOVE) ×2
GOWN STRL REUS W/TWL LRG LVL3 (GOWN DISPOSABLE) ×6 IMPLANT
KIT ABG SYR 3ML LUER SLIP (SYRINGE) IMPLANT
NEEDLE HYPO 22GX1.5 SAFETY (NEEDLE) ×3 IMPLANT
NEEDLE HYPO 25X5/8 SAFETYGLIDE (NEEDLE) IMPLANT
NEEDLE SPNL 20GX3.5 QUINCKE YW (NEEDLE) IMPLANT
NS IRRIG 1000ML POUR BTL (IV SOLUTION) ×3 IMPLANT
PACK C SECTION WH (CUSTOM PROCEDURE TRAY) ×3 IMPLANT
PENCIL SMOKE EVAC W/HOLSTER (ELECTROSURGICAL) ×3 IMPLANT
SUT MNCRL 0 VIOLET CTX 36 (SUTURE) ×2 IMPLANT
SUT MNCRL AB 3-0 PS2 27 (SUTURE) IMPLANT
SUT MON AB 2-0 CT1 27 (SUTURE) ×3 IMPLANT
SUT MON AB-0 CT1 36 (SUTURE) ×6 IMPLANT
SUT MONOCRYL 0 CTX 36 (SUTURE) ×4
SUT PLAIN 0 NONE (SUTURE) IMPLANT
SUT PLAIN 2 0 (SUTURE)
SUT PLAIN 2 0 XLH (SUTURE) IMPLANT
SUT PLAIN ABS 2-0 CT1 27XMFL (SUTURE) IMPLANT
SYR 20CC LL (SYRINGE) IMPLANT
SYR CONTROL 10ML LL (SYRINGE) ×3 IMPLANT
TOWEL OR 17X24 6PK STRL BLUE (TOWEL DISPOSABLE) ×3 IMPLANT
TRAY FOLEY W/BAG SLVR 14FR LF (SET/KITS/TRAYS/PACK) ×3 IMPLANT
WATER STERILE IRR 1000ML POUR (IV SOLUTION) ×3 IMPLANT

## 2021-02-21 NOTE — MAU Provider Note (Signed)
History     CSN: [id number]  Arrival date and time: 02/21/21 7412   Event Date/Time   First Provider Initiated Contact with Patient 02/21/21 0754      Chief Complaint  Patient presents with   BP Evaluation   HPI Fontella Shan is a 40 y.o. G2P0010 at [redacted]w[redacted]d who presents with elevated blood pressures. She states she has been checking her BP daily because she has had an increase in swelling. This morning she felt like her eyes were more swollen so she checked her BP and it was 155/100. She denies any HA, visual changes or epigastric pain. She denies any abdominal pain, vaginal bleeding or discharge. She reports normal fetal movement.   She had an episode of elevated blood pressures in the office on 10/4 with normal preeclampsia labs.   OB History     Gravida  2   Para      Term      Preterm      AB  1   Living         SAB  1   IAB      Ectopic      Multiple      Live Births              Past Medical History:  Diagnosis Date   Pregnancy induced hypertension     Past Surgical History:  Procedure Laterality Date   WRIST SURGERY Left     Family History  Problem Relation Age of Onset   Hyperlipidemia Mother    Hypertension Mother    Cancer Mother        pancreatic and breast    Social History   Tobacco Use   Smoking status: Never   Smokeless tobacco: Never  Vaping Use   Vaping Use: Never used  Substance Use Topics   Alcohol use: Not Currently   Drug use: Never    Allergies:  Allergies  Allergen Reactions   Food Swelling    Jalapenos-oral/mouth swelling    Medications Prior to Admission  Medication Sig Dispense Refill Last Dose   docusate sodium (COLACE) 100 MG capsule Take 100 mg by mouth in the morning.   02/21/2021   sertraline (ZOLOFT) 100 MG tablet Take 50 mg by mouth in the morning.   02/21/2021   Prenatal MV-Min-Fe Fum-FA-DHA (PRENATAL+DHA PO) Take 1 tablet by mouth in the morning.      triamcinolone ointment (KENALOG) 0.1 % Apply  1 application topically 2 (two) times daily as needed (skin irritation/ezcema).       Review of Systems Physical Exam   Blood pressure (!) 162/103, pulse 84, temperature 97.9 F (36.6 C), temperature source Oral, resp. rate 20, height 5\' 2"  (1.575 m), weight 112.4 kg, SpO2 99 %.  Physical Exam  MAU Course  Procedures  MDM Prenatal records from private office reviewed. Pregnancy uncomplicated. Labs ordered and reviewed.  UA Patient arrived to unit with severe range BPs  CBC, CMP, Protein/creat ratio Preeclampsia protocol ordered  Pt informed that the ultrasound is considered a limited OB ultrasound and is not intended to be a complete ultrasound exam.  Patient also informed that the ultrasound is not being completed with the intent of assessing for fetal or placental anomalies or any pelvic abnormalities.  Explained that the purpose of today's ultrasound is to assess for  presentation.  Patient acknowledges the purpose of the exam and the limitations of the study.    Breech presentation confirmed   Dr.  Law notified of patient arrival and severe range BPs. States patient is a patient of Dr. Billy Coast and he will need to be notified of her arrival.   Dr. Billy Coast called primary RN will CNM on the phone with Dr. Conni Elliot and notified RN of intent to deliver patient today by primary c/s  Assessment and Plan  -Gestational Hypertension -[redacted] weeks gestation  -Prepare for OR -Care turned over to MD  Rolm Bookbinder CNM 02/21/2021, 7:55 AM

## 2021-02-21 NOTE — Anesthesia Preprocedure Evaluation (Addendum)
Anesthesia Evaluation  Patient identified by MRN, date of birth, ID band Patient awake    Reviewed: Allergy & Precautions, NPO status , Patient's Chart, lab work & pertinent test results  Airway Mallampati: II  TM Distance: >3 FB Neck ROM: Full    Dental no notable dental hx.    Pulmonary neg pulmonary ROS,    Pulmonary exam normal breath sounds clear to auscultation       Cardiovascular hypertension, Normal cardiovascular exam Rhythm:Regular Rate:Normal     Neuro/Psych PSYCHIATRIC DISORDERS negative neurological ROS     GI/Hepatic negative GI ROS, Neg liver ROS,   Endo/Other  Morbid obesity  Renal/GU negative Renal ROS     Musculoskeletal negative musculoskeletal ROS (+)   Abdominal (+) + obese,   Peds  Hematology  (+) anemia ,   Anesthesia Other Findings Breech Presentation  Reproductive/Obstetrics (+) Pregnancy                            Anesthesia Physical Anesthesia Plan  ASA: 3  Anesthesia Plan: Spinal   Post-op Pain Management:    Induction:   PONV Risk Score and Plan: 2 and Ondansetron, Dexamethasone and Treatment may vary due to age or medical condition  Airway Management Planned: Natural Airway  Additional Equipment:   Intra-op Plan:   Post-operative Plan:   Informed Consent: I have reviewed the patients History and Physical, chart, labs and discussed the procedure including the risks, benefits and alternatives for the proposed anesthesia with the patient or authorized representative who has indicated his/her understanding and acceptance.     Dental advisory given  Plan Discussed with: CRNA  Anesthesia Plan Comments:        Anesthesia Quick Evaluation

## 2021-02-21 NOTE — Op Note (Signed)
Cesarean Section Procedure Note  Indications: malpresentation: complete breech and gest htn  Pre-operative Diagnosis: 38 week 3 day pregnancy.  Post-operative Diagnosis: same  Surgeon: Lenoard Aden   Assistants: RN  Anesthesia: Local anesthesia 0.25.% bupivacaine and Spinal anesthesia  ASA Class: 2  Procedure Details  The patient was seen in the Holding Room. The risks, benefits, complications, treatment options, and expected outcomes were discussed with the patient.  The patient concurred with the proposed plan, giving informed consent. The risks of anesthesia, infection, bleeding and possible injury to other organs discussed. Injury to bowel, bladder, or ureter with possible need for repair discussed. Possible need for transfusion with secondary risks of hepatitis or HIV acquisition discussed. Post operative complications to include but not limited to DVT, PE and Pneumonia noted. The site of surgery properly noted/marked. The patient was taken to Operating Room # B, identified as Sheanna Dail and the procedure verified as C-Section Delivery. A Time Out was held and the above information confirmed.  After induction of anesthesia, the patient was draped and prepped in the usual sterile manner. A Pfannenstiel incision was made and carried down through the subcutaneous tissue to the fascia. Fascial incision was made and extended transversely using Mayo scissors. The fascia was separated from the underlying rectus tissue superiorly and inferiorly. The peritoneum was identified and entered. Peritoneal incision was extended longitudinally. The utero-vesical peritoneal reflection was incised transversely and the bladder flap was bluntly freed from the lower uterine segment. A low transverse uterine incision(Kerr hysterotomy) was made. Delivered from complete breech presentation was a  female with Apgar scores of 8 at one minute and 9 at five minutes. Bulb suctioning gently performed. Neonatal team in  attendance.After the umbilical cord was clamped and cut cord blood was obtained for evaluation. The placenta was removed intact and appeared normal. The uterus was curetted with a dry lap pack. Good hemostasis was noted.The uterine outline, tubes and ovaries appeared normal. The uterine incision was closed with running locked sutures of 0 Monocryl x 2 layers. Hemostasis was observed. . The fascia was then reapproximated with running sutures of 0 Monocryl. The skin was reapproximated with 3-0 monocry after [place] closure with 2-0 plain.  Instrument, sponge, and needle counts were correct prior the abdominal closure and at the conclusion of the case.   Findings: FTLF, breech , ant placenta  Estimated Blood Loss:  300 mL         Drains: foley                 Specimens: placenta                 Complications:  None; patient tolerated the procedure well.         Disposition: PACU - hemodynamically stable.         Condition: stable  Attending Attestation: I performed the procedure.

## 2021-02-21 NOTE — H&P (Signed)
Angie Tucker is a 40 y.o. female presenting for gest htn and breech presentation for primary csection OB History     Gravida  2   Para      Term      Preterm      AB  1   Living         SAB  1   IAB      Ectopic      Multiple      Live Births             Past Medical History:  Diagnosis Date   Pregnancy induced hypertension    Past Surgical History:  Procedure Laterality Date   WRIST SURGERY Left    Family History: family history includes Cancer in her mother; Hyperlipidemia in her mother; Hypertension in her mother. Social History:  reports that she has never smoked. She has never used smokeless tobacco. She reports that she does not currently use alcohol. She reports that she does not use drugs.     Maternal Diabetes: No Genetic Screening: Normal Maternal Ultrasounds/Referrals: Normal Fetal Ultrasounds or other Referrals:  None Maternal Substance Abuse:  No Significant Maternal Medications:  Zoloft Significant Maternal Lab Results:  GBS pos Other Comments:  None  Review of Systems  Constitutional: Negative.   All other systems reviewed and are negative. Maternal Medical History:  Fetal activity: Perceived fetal activity is normal.   Last perceived fetal movement was within the past hour.   Prenatal complications: PIH.   Prenatal Complications - Diabetes: none.    Blood pressure (!) 172/101, pulse 78, temperature 97.9 F (36.6 C), temperature source Oral, resp. rate 20, height 5\' 2"  (1.575 m), weight 112.4 kg, SpO2 100 %. Maternal Exam:  Uterine Assessment: Contraction strength is mild.  Contraction frequency is rare.  Abdomen: Patient reports no abdominal tenderness. Fetal presentation: breech Introitus: Normal vulva. Normal vagina.  Ferning test: not done.  Nitrazine test: not done. Amniotic fluid character: not assessed. Pelvis: questionable for delivery.   Cervix: Cervix evaluated by digital exam.    Physical Exam Constitutional:       Appearance: Normal appearance. She is normal weight.  HENT:     Head: Normocephalic and atraumatic.  Cardiovascular:     Rate and Rhythm: Normal rate and regular rhythm.     Pulses: Normal pulses.     Heart sounds: Normal heart sounds.  Pulmonary:     Effort: Pulmonary effort is normal.     Breath sounds: Normal breath sounds.  Abdominal:     General: Bowel sounds are normal.     Palpations: Abdomen is soft.  Genitourinary:    General: Normal vulva.  Musculoskeletal:        General: Normal range of motion.     Cervical back: Normal range of motion and neck supple.  Skin:    General: Skin is warm and dry.  Neurological:     General: No focal deficit present.     Mental Status: She is alert and oriented to person, place, and time.  Psychiatric:        Mood and Affect: Mood normal.        Behavior: Behavior normal.    Prenatal labs: ABO, Rh:  pos Antibody:  neg Rubella:  imm RPR:   neg HBsAg:   neg HIV:   neg GBS:   pos  Assessment/Plan: 38 week IUP Breech- confirmed by sono Gest HTN- severe range BPs Primary csection . Risks vs benefits of  surgery discussed Proceed with csection.  Discussed with OB OR and anesthesia, due to severe range BPs will proceed with T/S pending (due to lab delay in processing.)  Angie Tucker 02/21/2021, 8:08 AM

## 2021-02-21 NOTE — Anesthesia Postprocedure Evaluation (Signed)
Anesthesia Post Note  Patient: Angie Tucker  Procedure(s) Performed: Primary CESAREAN SECTION     Patient location during evaluation: PACU Anesthesia Type: Spinal Level of consciousness: awake Pain management: pain level controlled Vital Signs Assessment: post-procedure vital signs reviewed and stable Respiratory status: spontaneous breathing, respiratory function stable and patient connected to nasal cannula oxygen Cardiovascular status: blood pressure returned to baseline and stable Postop Assessment: no headache, no backache and no apparent nausea or vomiting Anesthetic complications: no   No notable events documented.  Last Vitals:  Vitals:   02/21/21 1516 02/21/21 1820  BP: 124/78 (!) 144/74  Pulse: 68 66  Resp: 17   Temp: 36.7 C   SpO2:      Last Pain:  Vitals:   02/21/21 2000  TempSrc:   PainSc: 2    Pain Goal:                   [name] [name]

## 2021-02-21 NOTE — MAU Note (Signed)
Presents for BP evaluation, reports BP 155/100 when taken @ home this morning. Denies H/A, visual disturbances and epigastric pain. Endorses +FM.  Denies VB or LOF.

## 2021-02-21 NOTE — Transfer of Care (Signed)
Immediate Anesthesia Transfer of Care Note  Patient: Angie Tucker  Procedure(s) Performed: Primary CESAREAN SECTION  Patient Location: PACU  Anesthesia Type:Spinal  Level of Consciousness: awake, alert  and oriented  Airway & Oxygen Therapy: Patient Spontanous Breathing  Post-op Assessment: Report given to RN and Post -op Vital signs reviewed and stable  Post vital signs: Reviewed and stable  Last Vitals:  Vitals Value Taken Time  BP 102/69 02/21/21 1050  Temp 36.4 C 02/21/21 1050  Pulse 56 02/21/21 1056  Resp 13 02/21/21 1056  SpO2 100 % 02/21/21 1056  Vitals shown include unvalidated device data.  Last Pain:  Vitals:   02/21/21 1050  TempSrc: Oral  PainSc: 0-No pain         Complications: No notable events documented.

## 2021-02-21 NOTE — Anesthesia Procedure Notes (Signed)
Spinal  Patient location during procedure: OR Start time: 02/21/2021 9:40 AM End time: 02/21/2021 9:45 AM Reason for block: surgical anesthesia Staffing Performed: anesthesiologist  Anesthesiologist: Leonides Grills, MD Preanesthetic Checklist Completed: patient identified, IV checked, risks and benefits discussed, surgical consent, monitors and equipment checked, pre-op evaluation and timeout performed Spinal Block Patient position: sitting Prep: DuraPrep Patient monitoring: cardiac monitor, continuous pulse ox and blood pressure Approach: midline Location: L4-5 Injection technique: single-shot Needle Needle type: Pencan  Needle gauge: 24 G Needle length: 9 cm Assessment Sensory level: T10 Events: CSF return Additional Notes Functioning IV was confirmed and monitors were applied. Sterile prep and drape, including hand hygiene and sterile gloves were used. The patient was positioned and the spine was prepped. The skin was anesthetized with lidocaine.  Free flow of clear CSF was obtained on the second attempt prior to injecting local anesthetic into the CSF.  The spinal needle aspirated freely following injection.  The needle was carefully withdrawn.  The patient tolerated the procedure well.

## 2021-02-21 NOTE — Lactation Note (Signed)
This note was copied from a baby's chart. Lactation Consultation Note  Patient Name: Angie Tucker [id number]'N Date: 02/21/2021 Reason for consult: Initial assessment;Early term 37-38.6wks Age:40 hours   P1 mom [facility]. Difficulty with baby [facility] latching to the left side.  Mom is able to hand express.  LC assisted with Hand expression and fed multiple drops back to baby.  Infant takes a few sucks after latching and then lose the latch.  Laid back and football positions attempted.  Manual pump reviewed and used.  Encouraged STS, hand expression, and feeding with cues.    When sucking gloved finger, infant had trouble forming seal at first but after suck training, infant suck improved.  Humping noted at back of tongue and tightness noted posterior tongue.  Lactation brochure provided and mom was encouraged to follow up with OP LC.  She is aware of [facility] and also [place] OP LC.  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  Audible Swallowing: A few with stimulation  Type of Nipple: Everted at rest and after stimulation (shorter shaft)  Comfort (Breast/Nipple): Soft / non-tender  Hold (Positioning): Assistance needed to correctly position infant at breast and maintain latch.  LATCH Score: 7   Lactation Tools Discussed/Used Tools: Pump Breast pump type: Manual Pump Education: Milk Storage Reason for Pumping: evert tissue to help latch  Interventions Interventions: Breast feeding basics reviewed;Assisted with latch;Skin to skin;Hand express;Adjust position;Support pillows;Position options;Hand pump;Education;LC Services brochure  Discharge Pump:  Employee Pump (has already recieved her pump-) WIC Program: No  Consult Status Consult Status: Follow-up Date: 02/22/21 Follow-up type: In-patient    Maryruth Hancock [facility] 02/21/2021, 5:07 PM

## 2021-02-22 ENCOUNTER — Encounter (HOSPITAL_COMMUNITY)
Admission: RE | Admit: 2021-02-22 | Discharge: 2021-02-22 | Disposition: A | Discharge status: Home / Self Care | Payer: 59 | Source: Ambulatory Visit | Attending: Obstetrics and Gynecology | Admitting: Obstetrics and Gynecology

## 2021-02-22 DIAGNOSIS — F419 Anxiety disorder, unspecified: Secondary | ICD-10-CM

## 2021-02-22 DIAGNOSIS — F32A Depression, unspecified: Secondary | ICD-10-CM

## 2021-02-22 DIAGNOSIS — D62 Acute posthemorrhagic anemia: Secondary | ICD-10-CM

## 2021-02-22 HISTORY — DX: Gestational (pregnancy-induced) hypertension without significant proteinuria, unspecified trimester: O13.9

## 2021-02-22 LAB — CBC
HCT: 21.8 % — ABNORMAL LOW (ref 36.0–46.0)
Hemoglobin: 7.4 g/dL — ABNORMAL LOW (ref 12.0–15.0)
MCH: 28.7 pg (ref 26.0–34.0)
MCHC: 33.9 g/dL (ref 30.0–36.0)
MCV: 84.5 fL (ref 80.0–100.0)
Platelets: 173 10*3/uL (ref 150–400)
RBC: 2.58 MIL/uL — ABNORMAL LOW (ref 3.87–5.11)
RDW: 13.2 % (ref 11.5–15.5)
WBC: 12.2 10*3/uL — ABNORMAL HIGH (ref 4.0–10.5)
nRBC: 0 % (ref 0.0–0.2)

## 2021-02-22 MED ORDER — SODIUM CHLORIDE 0.9 % IV SOLN
500.0000 mg | Freq: Once | INTRAVENOUS | Status: AC
  Administered 2021-02-22: 500 mg via INTRAVENOUS
  Filled 2021-02-22: qty 25

## 2021-02-22 MED ORDER — SERTRALINE HCL 50 MG PO TABS
50.0000 mg | ORAL_TABLET | Freq: Every day | ORAL | Status: DC
  Administered 2021-02-22 – 2021-02-23 (×2): 50 mg via ORAL
  Filled 2021-02-22 (×2): qty 1

## 2021-02-22 NOTE — Progress Notes (Addendum)
POSTOPERATIVE DAY # 1 S/P Primary LTCS for complete breech and GTHN, baby girl "[facility]"     S:         Reports feeling well. Denies HA, visual changes, RUQ/epigastric pain, CP, SOB, increase in edema.  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 / no nausea / no vomiting / no flatus / no BM  Denies dizziness, SOB, or CP             Bleeding is light             Pain controlled with Toradol and Percocet             Up ad lib / ambulatory/ voiding QS x2 without difficulty   Newborn breast feeding with formula supplementation - feels like colostrum production is low   O:  VS: BP 133/80 (BP Location: Left Arm)   Pulse 61   Temp 98 F (36.7 C) (Oral)   Resp 18   Ht 5\' 2"  (1.575 m)   Wt 112.4 kg   SpO2 100%   Breastfeeding Unknown   BMI 45.32 kg/m  Patient Vitals for the past 24 hrs:  BP Temp Temp src Pulse Resp SpO2  02/22/21 0255 133/80 98 F (36.7 C) Oral 61 18 100 %  02/21/21 2245 135/72 98.1 F (36.7 C) Oral 71 18 100 %  02/21/21 1820 (!) 144/74 -- -- 66 -- --  02/21/21 1516 124/78 98 F (36.7 C) Oral 68 17 --  02/21/21 1408 139/82 98.5 F (36.9 C) Oral 66 18 --  02/21/21 1311 (!) 135/93 (!) 97.4 F (36.3 C) Oral 62 17 --  02/21/21 1207 130/90 97.8 F (36.6 C) Oral 63 18 100 %  02/21/21 1145 120/72 97.6 F (36.4 C) Oral 60 16 99 %  02/21/21 1130 130/72 -- -- (!) 58 17 98 %  02/21/21 1115 119/63 97.6 F (36.4 C) Axillary (!) 57 14 98 %  02/21/21 1100 115/74 -- -- (!) 56 14 100 %  02/21/21 1050 102/69 97.6 F (36.4 C) Oral (!) 58 15 99 %     LABS:               Recent Labs    02/21/21 0741 02/22/21 0514  WBC 8.0 12.2*  HGB 10.8* 7.4*  PLT 270 173               Bloodtype: --/--/O POS Performed at [facility], [address], [place], [place]  [phone]/06 [phone])  Rubella:                                               I&O: Intake/Output      11/06 0701 11/07 0700 11/07 0701 11/08 0700   P.O. 800     I.V. (mL/kg) 1300 (11.6)    Total Intake(mL/kg) 2100 (18.7)    Urine (mL/kg/hr) 2990 300 (1.4)   Blood 1016    Total Output 4006 300   Net -1906 -300                     Physical Exam:             Alert and Oriented X3  Lungs: Clear and unlabored  Heart: regular rate and rhythm / (+)  murmur  Abdomen: soft, non-tender, mild gaseous distention, active bowel sounds in all quadrants              Fundus: firm, non-tender, below umbilicus              Dressing: old drainage  marked about 40% on right side of dressing, then drainage noted on left side; otherwise intact              Incision:  approximated with sutures / no erythema / no ecchymosis / no drainage  Perineum: intact  Lochia: appropriate   Extremities: +1 LE edema, no calf pain or tenderness, neg Homans, +2 DTRs and no clonus bilaterally, SCDs in place   A:        POD # 1 S/P Primary LTCS for breech presentation and new onset GHTN            Gestational hypertension   - BPs improving on Procardia 30mg  XL daily   - no neural s/s or evidence of PEC  ABL anemia compounding chronic IDA   - IV Venofer x1    - Repeat CBC in AM  Hx. Of Depression/anxiety   - Resume Zoloft 50mg  PO every in AM   - Watch closely PP   Heart murmur   - Asymptomatic; patient reports diagnosed with murmur a long time ago and has not required further f/u Routine postoperative care              Change honeycomb dressing after shower  Ambulation encouraged  Warm liquids and ambulation encouraged to promote bowel motility   Desires early d/c home tomorrow if stable with close 1 week BP check   , MSN, CNM [facility]

## 2021-02-22 NOTE — Social Work (Signed)
MOB was referred for history of depression/anxiety.  * Referral screened out by Clinical Social Worker because none of the following criteria appear to apply:  ~ History of anxiety/depression during this pregnancy, or of post-partum depression following prior delivery. ~ Diagnosis of anxiety and/or depression within last 3 years OR  * MOB's symptoms currently being treated with medication and/or therapy. Patient symptoms are treated with Zoloft.   Please contact the Clinical Social Worker if needs arise, by [facility] request, or if MOB scores greater than 9/yes to question 10 on Edinburgh Postpartum Depression Screen.   Vivi Barrack, MSW, LCSW [facility]  Clinical Social Worker  [phone] 02/22/2021  8:11 AM

## 2021-02-23 ENCOUNTER — Other Ambulatory Visit: Payer: Self-pay

## 2021-02-23 LAB — CBC
HCT: 23.4 % — ABNORMAL LOW (ref 36.0–46.0)
Hemoglobin: 7.7 g/dL — ABNORMAL LOW (ref 12.0–15.0)
MCH: 28.6 pg (ref 26.0–34.0)
MCHC: 32.9 g/dL (ref 30.0–36.0)
MCV: 87 fL (ref 80.0–100.0)
Platelets: 218 10*3/uL (ref 150–400)
RBC: 2.69 MIL/uL — ABNORMAL LOW (ref 3.87–5.11)
RDW: 13.8 % (ref 11.5–15.5)
WBC: 11.8 10*3/uL — ABNORMAL HIGH (ref 4.0–10.5)
nRBC: 0.2 % (ref 0.0–0.2)

## 2021-02-23 MED ORDER — OXYCODONE-ACETAMINOPHEN 5-325 MG PO TABS
1.0000 | ORAL_TABLET | ORAL | 0 refills | Status: DC | PRN
Start: 2021-02-23 — End: 2021-07-14
  Filled 2021-02-23: qty 30, 3d supply, fill #0

## 2021-02-23 MED ORDER — ACETAMINOPHEN 325 MG PO TABS: 650.0000 mg | ORAL_TABLET | Freq: Four times a day (QID) | ORAL | Status: DC | PRN

## 2021-02-23 MED ORDER — NIFEDIPINE ER 30 MG PO TB24
30.0000 mg | ORAL_TABLET | Freq: Every day | ORAL | 1 refills | Status: DC
  Filled 2021-02-23: qty 30, 30d supply, fill #0

## 2021-02-23 MED ORDER — IBUPROFEN 600 MG PO TABS
600.0000 mg | ORAL_TABLET | Freq: Four times a day (QID) | ORAL | 0 refills | Status: DC
  Filled 2021-02-23: qty 30, 8d supply, fill #0

## 2021-02-23 NOTE — Progress Notes (Signed)
POSTOPERATIVE DAY # 2 S/P Primary LTCS for complete breech and GTHN, baby girl "[facility]"     S:         Reports feeling well. Denies HA, visual changes, RUQ/epigastric pain, CP, SOB, increase in edema.  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[employer]               O:  VS: BP 128/80 (BP Location: Left Arm)   Pulse 67   Temp 98.2 F (36.8 C) (Oral)   Resp 18   Ht 5\' 2"  (1.575 m)   Wt 112.4 kg   SpO2 100%   Breastfeeding Unknown   BMI 45.32 kg/m  Patient Vitals for the past 24 hrs:  BP Temp Temp src Pulse Resp  02/23/21 0550 128/80 98.2 F (36.8 C) Oral 67 18  02/22/21 2234 128/77 98.4 F (36.9 C) Oral 72 18  02/22/21 1515 136/90 -- -- 84 18      LABS:  CBC Latest Ref Rng & Units 02/23/2021 02/22/2021 02/21/2021  WBC 4.0 - 10.5 K/uL 11.8(H) 12.2(H) 8.0  Hemoglobin 12.0 - 15.0 g/dL 7.7(L) 7.4(L) 10.8(L)  Hematocrit 36.0 - 46.0 % 23.4(L) 21.8(L) 33.4(L)  Platelets 150 - 400 K/uL 218 173 270                 Bloodtype: --/--/O POS Performed at [facility], [address] [place], [place]  [phone]/06 [phone])  Rubella:    Immune                              Physical Exam:             Alert and Oriented X3  Lungs: Clear and unlabored  Heart: regular rate and rhythm / (+) murmur  Abdomen: soft, non-tender, mild gaseous distention, active bowel sounds in all quadrants              Fundus: firm, non-tender, below umbilicus              Dressing: old drainage  marked about 40% on right side of dressing, then drainage noted on left side; otherwise intact              Incision:  approximated with sutures / no erythema / no ecchymosis / no drainage  Perineum: intact  Lochia: appropriate   Extremities: +1 LE edema, no calf pain or tenderness, neg Homans, +2 DTRs and no clonus bilaterally, SCDs in place   A:        POD # 2 S/P Primary LTCS for breech, new onset GHTN            Gestational hypertension   - BPs improving on Procardia 30mg  XL daily   - no neural s/s or  evidence of PEC  ABL anemia compounding chronic IDA   - IV Venofer x1    - Repeat CBC stable   Hx. Of Depression/anxiety   - Resume Zoloft 50mg  PO every in AM   - Watch closely PP   Heart murmur   - Asymptomatic; patient reports diagnosed with murmur a long time ago and has not required further f/u  Discharge home, f/up Dr 13/06 on 02/26/21 for BP check  PP and post op care and reportable s/s d/w pt   Ethel Veronica MD [facility]

## 2021-02-23 NOTE — Discharge Summary (Signed)
Postpartum Discharge Summary  Patient Name: Angie Tucker DOB: 04-22-1980 MRN: [id number]  Date of admission: 02/21/2021 Delivery date:02/21/2021  Delivering provider: Brien Few  Date of discharge: 02/23/2021  Admitting diagnosis: Cesarean delivery indicated due to breech presentation [O32.1XX0] Hypertension affecting pregnancy [O16.9] Intrauterine pregnancy: [redacted]w[redacted]d   Secondary diagnosis:  Principal Problem:   Postpartum care following cesarean delivery 11/6 Active Problems:   Cesarean delivery indicated due to breech presentation   Hypertension affecting pregnancy   Acute on chronic blood loss anemia   Anxiety and depression  Additional problems: s/p IV Iron infusion     Discharge diagnosis: Term Pregnancy Delivered, Gestational Hypertension, and Anemia                                              Post partum procedures: IV Venafer on 02/22/21 Augmentation: N/A Complications: None  Hospital course:   40y.o. yo G2P1011 at 40w4das admitted to the hospital 02/21/2021 for Gestational HTN and breech and underwent her scheduled cesarean section few days earlier due to Gest HTN.  Delivery details are as follows:  Membrane Rupture Time/Date: 10:02 AM ,02/21/2021   Delivery Method:C-Section, Low Transverse , breech delivery. Girl  Details of operation can be found in separate operative note.  Patient had an uncomplicated postpartum course.  She is ambulating, tolerating a regular diet, passing flatus, and urinating well. Patient is discharged home in stable condition on  02/23/21        Newborn Data: Birth date:02/21/2021  Birth time:10:02 AM  Gender:Female  Living status:Living  Apgars:8 ,9  Weight:3590 g     Magnesium Sulfate received: No BMZ received: No Rhophylac:N/A MMR:No T-DaP:Given prenatally Flu: Yes Transfusion:No  Physical exam  Vitals:   02/22/21 0255 02/22/21 1515 02/22/21 2234 02/23/21 0550  BP: 133/80 136/90 128/77 128/80  Pulse: 61 84 72 67  Resp: '18  18 18 18  ' Temp: 98 F (36.7 C)  98.4 F (36.9 C) 98.2 F (36.8 C)  TempSrc: Oral  Oral Oral  SpO2: 100%     Weight:      Height:       General: alert Lochia: appropriate Uterine Fundus: firm Incision: Healing well with no significant drainage, No significant erythema DVT Evaluation: No evidence of DVT seen on physical exam. Negative Homan's sign. Labs: Lab Results  Component Value Date   WBC 11.8 (H) 02/23/2021   HGB 7.7 (L) 02/23/2021   HCT 23.4 (L) 02/23/2021   MCV 87.0 02/23/2021   PLT 218 02/23/2021   CMP Latest Ref Rng & Units 02/21/2021  Glucose 70 - 99 mg/dL 81  BUN 6 - 20 mg/dL 7  Creatinine 0.44 - 1.00 mg/dL 0.77  Sodium 135 - 145 mmol/L 135  Potassium 3.5 - 5.1 mmol/L 3.9  Chloride 98 - 111 mmol/L 108  CO2 22 - 32 mmol/L 20(L)  Calcium 8.9 - 10.3 mg/dL 8.7(L)  Total Protein 6.5 - 8.1 g/dL 6.2(L)  Total Bilirubin 0.3 - 1.2 mg/dL 0.3  Alkaline Phos 38 - 126 U/L 107  AST 15 - 41 U/L 22  ALT 0 - 44 U/L 17   Edinburgh Score: Edinburgh Postnatal Depression Scale Screening Tool 02/23/2021  I have been able to laugh and see the funny side of things. 0  I have looked forward with enjoyment to things. 0  I have blamed myself unnecessarily when  things went wrong. 1  I have been anxious or worried for no good reason. 0  I have felt scared or panicky for no good reason. 1  Things have been getting on top of me. 1  I have been so unhappy that I have had difficulty sleeping. 0  I have felt sad or miserable. 0  I have been so unhappy that I have been crying. 0  The thought of harming myself has occurred to me. 0  Edinburgh Postnatal Depression Scale Total 3     After visit meds:  Allergies as of 02/23/2021       Reactions   Food Swelling   Jalapenos-oral/mouth swelling        Medication List     TAKE these medications    acetaminophen 325 MG tablet Commonly known as: Tylenol Take 2 tablets (650 mg total) by mouth every 6 (six) hours as needed.    docusate sodium 100 MG capsule Commonly known as: COLACE Take 100 mg by mouth in the morning.   ibuprofen 600 MG tablet Commonly known as: ADVIL Take 1 tablet (600 mg total) by mouth every 6 (six) hours.   NIFEdipine 30 MG 24 hr tablet Commonly known as: ADALAT CC Take 1 tablet (30 mg total) by mouth daily. Start taking on: February 24, 2021   oxyCODONE-acetaminophen 5-325 MG tablet Commonly known as: PERCOCET/ROXICET Take 1-2 tablets by mouth every 4 (four) hours as needed for moderate pain.   PRENATAL+DHA PO Take 1 tablet by mouth in the morning.   sertraline 100 MG tablet Commonly known as: ZOLOFT Take 50 mg by mouth in the morning.   triamcinolone ointment 0.1 % Commonly known as: KENALOG Apply 1 application topically 2 (two) times daily as needed (skin irritation/ezcema).               Discharge Care Instructions  (From admission, onward)           Start     Ordered   02/23/21 0000  Change dressing (specify)       Comments: Remove dressing on 02/26/21   02/23/21 1206             Discharge home in stable condition Infant Feeding: Breast Infant Disposition:home with mother Discharge instruction: per After Visit Summary and Postpartum booklet. Activity: Advance as tolerated. Pelvic rest for 6 weeks.  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 Continue Procardia 88m XL   Post op and pp care d/w pt   02/23/2021 VElveria Royals MD

## 2021-02-23 NOTE — Lactation Note (Signed)
This note was copied from a baby's chart. Lactation Consultation Note  Patient Name: Angie Tucker [id number] Date: 02/23/2021 Reason for consult: Follow-up assessment Age:40 hours  P1, Mother is breastfeeding and formula feeding. Encouraged offering breast and pumping after to stimulate supply. Feed on demand with cues.  Goal 8-12+ times per day after first 24 hrs.  Place baby STS if not cueing.  Reviewed engorgement care and monitoring voids/stools.    Feeding Mother's Current Feeding Choice: Breast Milk and Formula Nipple Type: Nfant Extra Slow Flow (gold) Interventions Interventions: Breast feeding basics reviewed;Education  Discharge Pump: Personal  Consult Status Consult Status: Complete Date: 02/23/21    Dahlia Byes [facility] 02/23/2021, 1:23 PM

## 2021-02-24 ENCOUNTER — Encounter: Payer: Self-pay | Admitting: Dermatology

## 2021-02-26 ENCOUNTER — Other Ambulatory Visit: Payer: Self-pay

## 2021-02-26 DIAGNOSIS — R102 Pelvic and perineal pain: Secondary | ICD-10-CM | POA: Diagnosis not present

## 2021-02-26 DIAGNOSIS — O139 Gestational [pregnancy-induced] hypertension without significant proteinuria, unspecified trimester: Secondary | ICD-10-CM | POA: Diagnosis not present

## 2021-02-26 MED ORDER — NIFEDIPINE ER 30 MG PO TB24
ORAL_TABLET | ORAL | 3 refills | Status: DC
  Filled 2021-02-26 – 2021-03-08 (×2): qty 60, 30d supply, fill #0
  Filled 2021-04-14: qty 60, 30d supply, fill #1
  Filled 2021-06-29: qty 60, 30d supply, fill #2

## 2021-03-03 ENCOUNTER — Inpatient Hospital Stay: Admit: 2021-03-03 | Payer: Self-pay

## 2021-03-04 DIAGNOSIS — R030 Elevated blood-pressure reading, without diagnosis of hypertension: Secondary | ICD-10-CM | POA: Diagnosis not present

## 2021-03-08 ENCOUNTER — Telehealth (HOSPITAL_COMMUNITY): Payer: Self-pay | Admitting: *Deleted

## 2021-03-08 ENCOUNTER — Other Ambulatory Visit: Payer: Self-pay

## 2021-03-08 NOTE — Telephone Encounter (Signed)
Mom reports feeling good. No concerns about herself at this time. Incision healing well per mom. Dr. Billy Coast aware of increased BP per mom. Mom asked how long vaginal bleeding would last. Discussed bleeding may last 3-4 weeks tapering over time. Call OB if saturating a large pad in less than an hour. EPDS=5  [facility] score=3 ) Mom reports baby is doing well. Feeding, peeing, and pooping without difficulty. Safe sleep reviewed. Mom reports no concerns about baby at present.  Duffy Rhody, RN 03-08-2021 at 12:45pm

## 2021-03-17 MED FILL — Sertraline HCl Tab 100 MG: ORAL | 90 days supply | Qty: 90 | Fill #1 | Status: AC

## 2021-03-18 ENCOUNTER — Other Ambulatory Visit: Payer: Self-pay

## 2021-03-18 DIAGNOSIS — O139 Gestational [pregnancy-induced] hypertension without significant proteinuria, unspecified trimester: Secondary | ICD-10-CM | POA: Diagnosis not present

## 2021-03-31 IMAGING — US US OB < 14 WEEKS - US OB TV
1 series · 14 of 28 positions shown · non-contrast
Comparison: None.

CLINICAL DATA: Dating

EXAM:
OBSTETRIC <14 WK US AND TRANSVAGINAL OB US
TECHNIQUE: Both transabdominal and transvaginal ultrasound examinations were
performed for complete evaluation of the gestation as well as the
maternal uterus, adnexal regions, and pelvic cul-de-sac.
Transvaginal technique was performed to assess early pregnancy.

[Series 1: us ob < 14 weeks - us ob tv · 0.15mm/px · 14 of 218 slices shown]
[im 9/218]
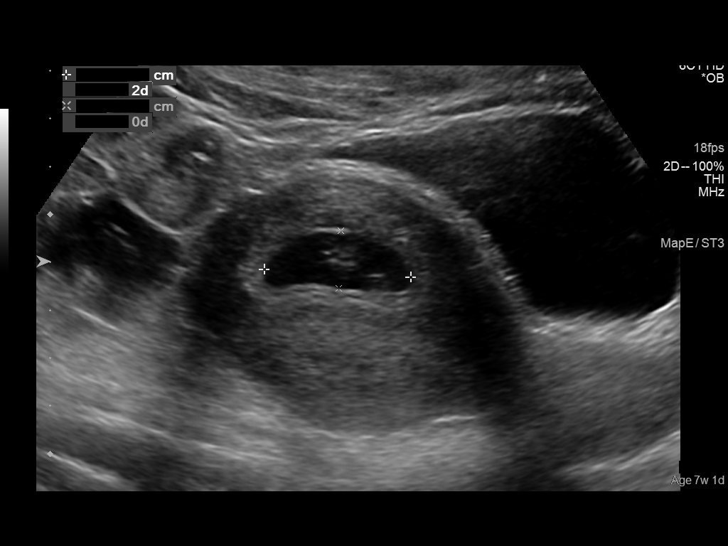
[im 25/218]
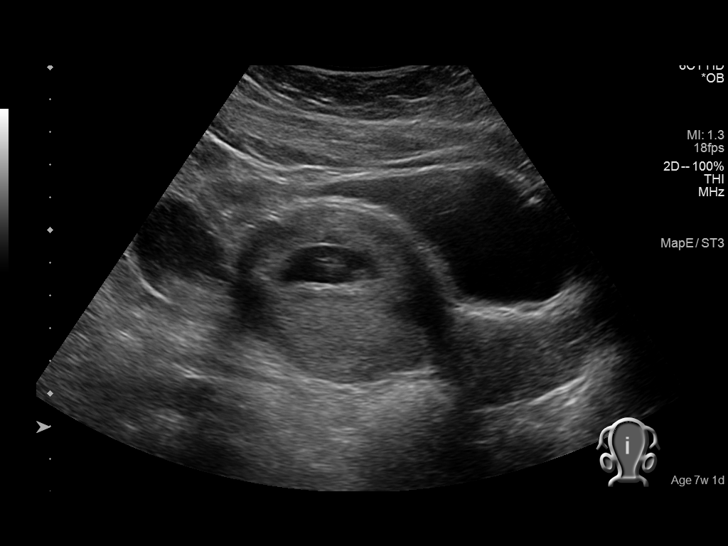
[im 41/218]
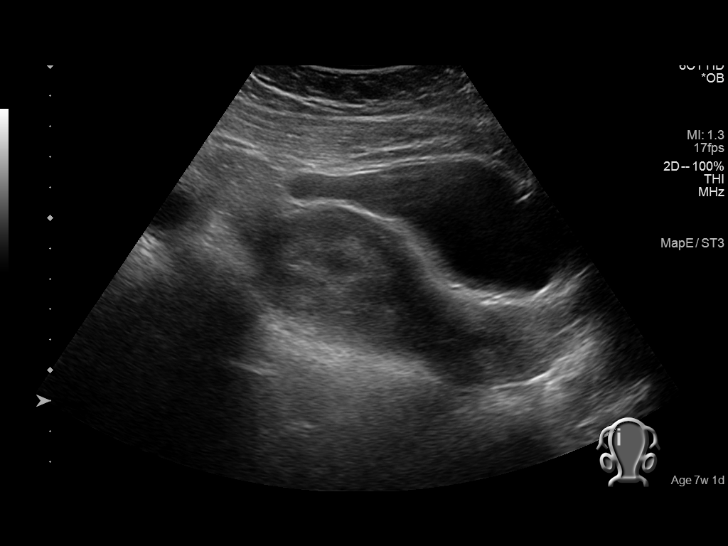
[im 57/218]
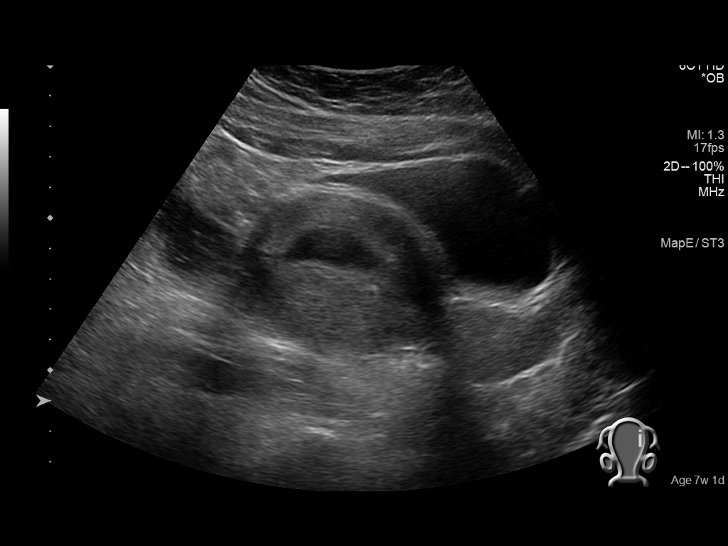
[im 73/218]
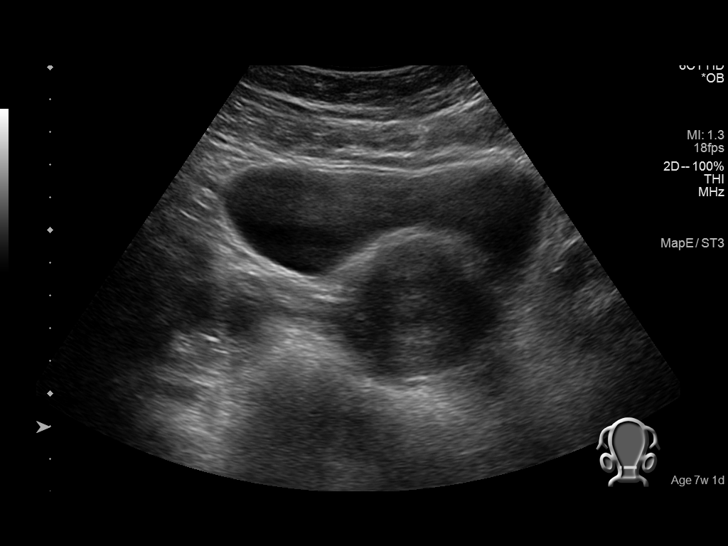
[im 89/218]
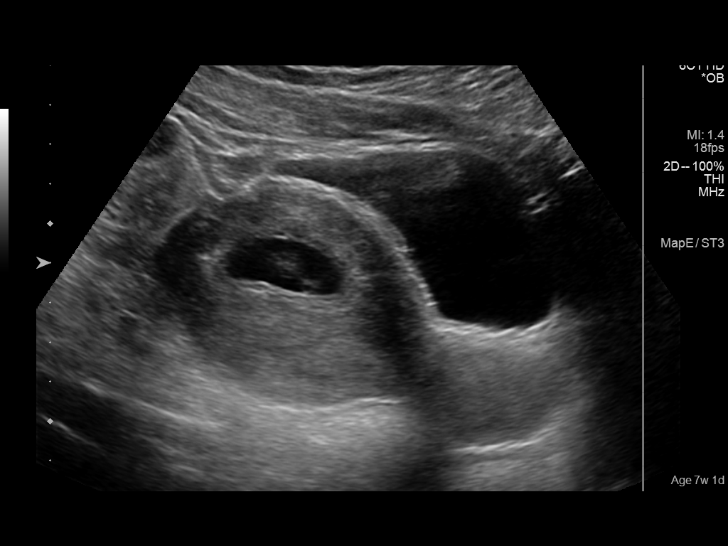
[im 105/218]
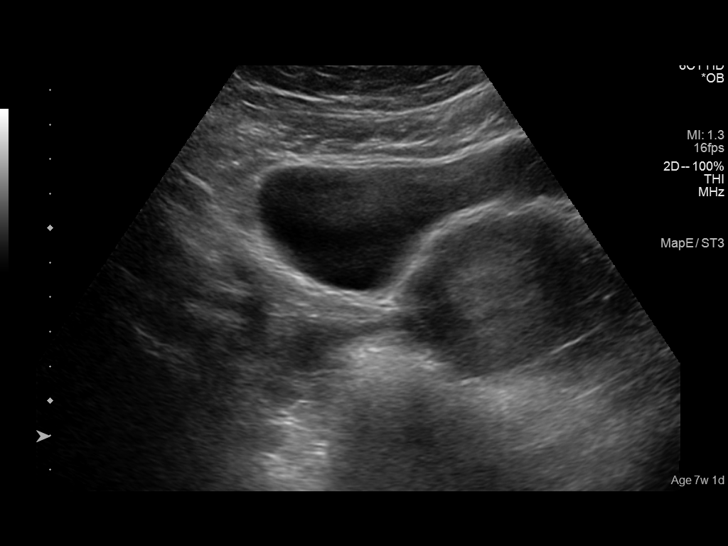
[im 121/218]
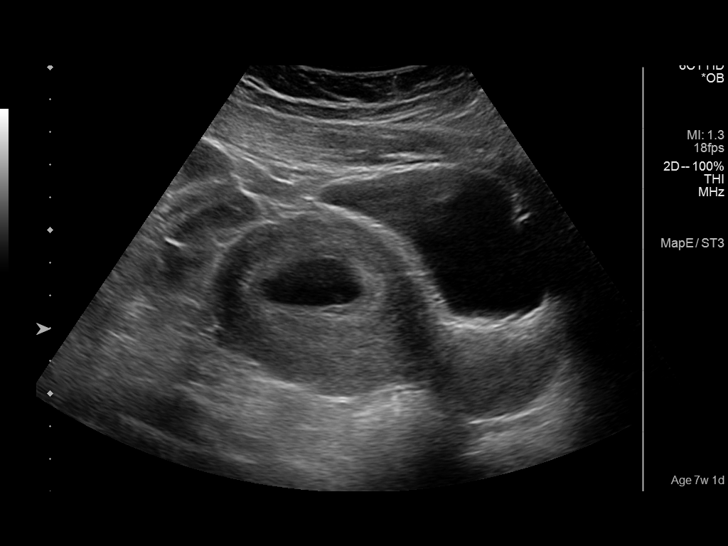
[im 137/218]
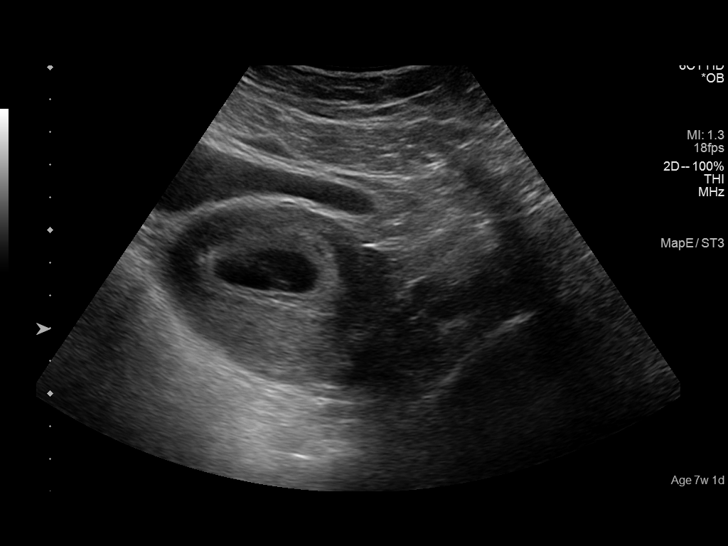
[im 153/218]
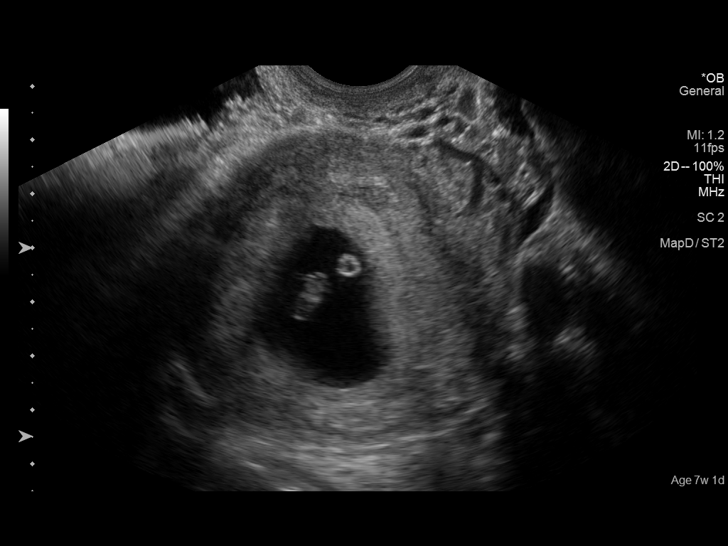
[im 169/218]
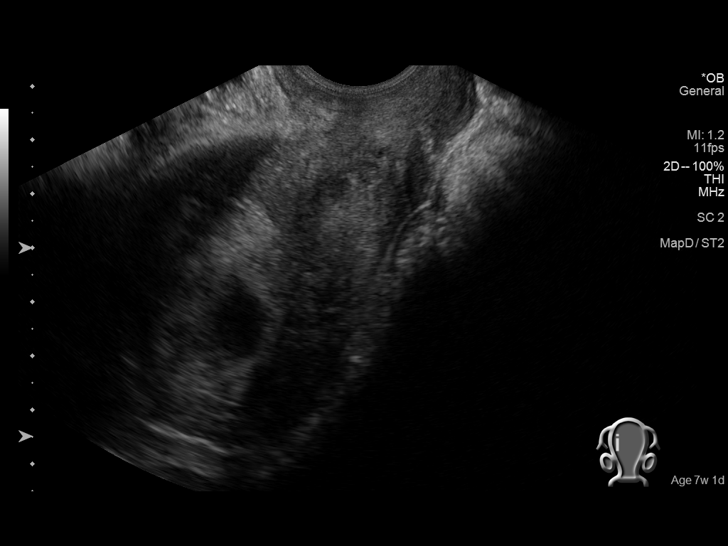
[im 185/218]
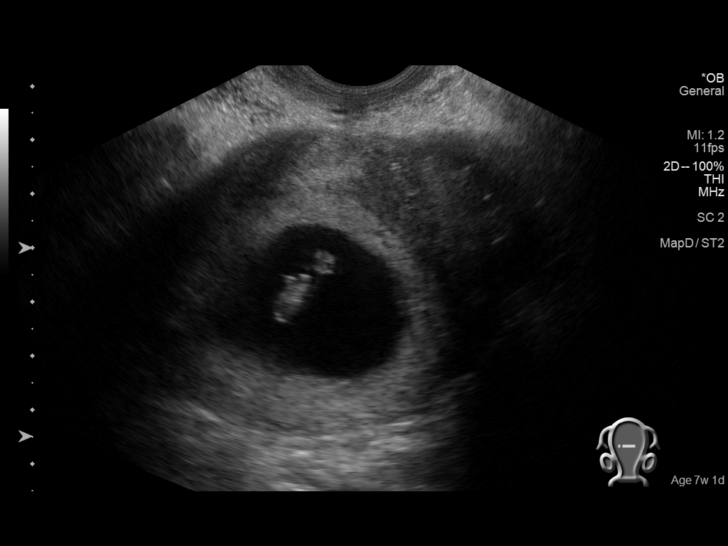
[im 201/218]
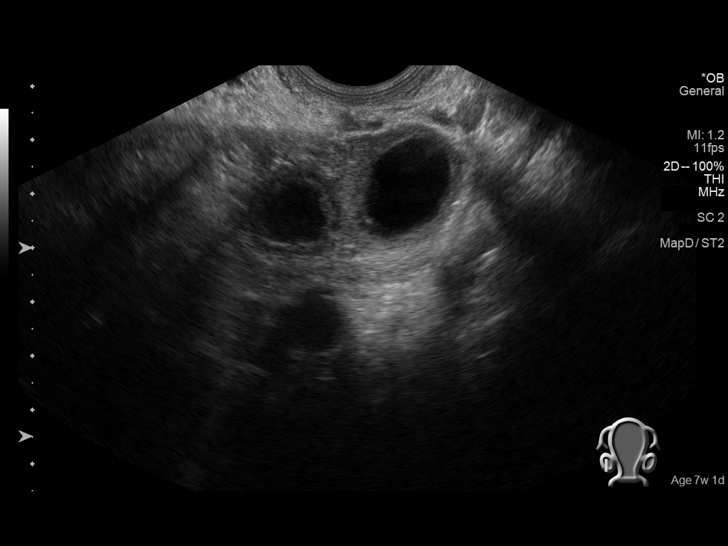
[im 218/218]
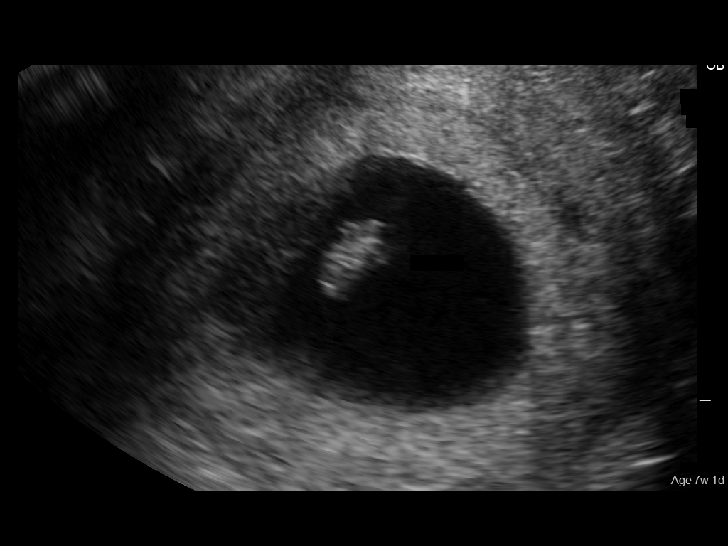

[14 of 28 positions shown; findings below may reference images not displayed]

FINDINGS: Intrauterine gestational sac: Single

Yolk sac:  Visualized.

Embryo:  Visualized.

Cardiac Activity: Visualized.

Heart Rate: 133 bpm

MSD:   mm    w     d

CRL:  10.2 mm   7 w   1 d                  US EDC: 03/03/2021

Subchorionic hemorrhage:  None visualized.

Maternal uterus/adnexae: Right corpus luteal cyst. No adnexal mass
or free fluid.
IMPRESSION: Seven week 1 day intrauterine pregnancy. Fetal heart rate 133 beats
per minute. No acute maternal findings.

## 2021-04-07 ENCOUNTER — Other Ambulatory Visit: Payer: Self-pay

## 2021-04-07 DIAGNOSIS — Z124 Encounter for screening for malignant neoplasm of cervix: Secondary | ICD-10-CM | POA: Diagnosis not present

## 2021-04-07 MED ORDER — ETONOGESTREL-ETHINYL ESTRADIOL 0.12-0.015 MG/24HR VA RING
VAGINAL_RING | VAGINAL | 3 refills | Status: DC
  Filled 2021-04-07: qty 3, 84d supply, fill #0
  Filled 2021-07-08: qty 3, 84d supply, fill #1
  Filled 2021-10-07: qty 3, 84d supply, fill #2
  Filled 2022-01-02: qty 3, 84d supply, fill #3

## 2021-04-07 MED FILL — Valacyclovir HCl Tab 1 GM: ORAL | 90 days supply | Qty: 90 | Fill #2 | Status: AC

## 2021-04-15 ENCOUNTER — Other Ambulatory Visit: Payer: Self-pay

## 2021-06-02 ENCOUNTER — Other Ambulatory Visit: Payer: Self-pay

## 2021-06-02 MED ORDER — PREDNISONE 20 MG PO TABS
ORAL_TABLET | ORAL | 2 refills | Status: DC
  Filled 2021-06-02: qty 10, 10d supply, fill #0
  Filled 2021-10-05: qty 10, 10d supply, fill #1
  Filled 2021-10-20 – 2021-11-16 (×2): qty 10, 10d supply, fill #2

## 2021-06-29 ENCOUNTER — Other Ambulatory Visit: Payer: Self-pay

## 2021-06-29 MED FILL — Sertraline HCl Tab 100 MG: ORAL | 90 days supply | Qty: 90 | Fill #2 | Status: AC

## 2021-07-08 ENCOUNTER — Other Ambulatory Visit: Payer: Self-pay | Admitting: Family

## 2021-07-08 ENCOUNTER — Other Ambulatory Visit: Payer: Self-pay

## 2021-07-09 ENCOUNTER — Other Ambulatory Visit: Payer: Self-pay

## 2021-07-09 ENCOUNTER — Other Ambulatory Visit: Payer: Self-pay | Admitting: Family

## 2021-07-09 MED FILL — Sertraline HCl Tab 100 MG: ORAL | Qty: 90 | Fill #0 | Status: CN

## 2021-07-14 ENCOUNTER — Ambulatory Visit (INDEPENDENT_AMBULATORY_CARE_PROVIDER_SITE_OTHER): Payer: 59 | Admitting: Family

## 2021-07-14 ENCOUNTER — Other Ambulatory Visit: Payer: Self-pay | Admitting: Family

## 2021-07-14 ENCOUNTER — Telehealth: Payer: Self-pay | Admitting: Family

## 2021-07-14 ENCOUNTER — Other Ambulatory Visit: Payer: Self-pay

## 2021-07-14 ENCOUNTER — Encounter: Payer: Self-pay | Admitting: Family

## 2021-07-14 VITALS — BP 118/68 | HR 66 | Temp 97.5°F | Ht 62.0 in | Wt 208.6 lb

## 2021-07-14 DIAGNOSIS — Z87898 Personal history of other specified conditions: Secondary | ICD-10-CM | POA: Diagnosis not present

## 2021-07-14 DIAGNOSIS — F419 Anxiety disorder, unspecified: Secondary | ICD-10-CM | POA: Diagnosis not present

## 2021-07-14 DIAGNOSIS — Z0000 Encounter for general adult medical examination without abnormal findings: Secondary | ICD-10-CM

## 2021-07-14 DIAGNOSIS — Z1231 Encounter for screening mammogram for malignant neoplasm of breast: Secondary | ICD-10-CM

## 2021-07-14 DIAGNOSIS — F32A Depression, unspecified: Secondary | ICD-10-CM | POA: Diagnosis not present

## 2021-07-14 DIAGNOSIS — B009 Herpesviral infection, unspecified: Secondary | ICD-10-CM

## 2021-07-14 DIAGNOSIS — O169 Unspecified maternal hypertension, unspecified trimester: Secondary | ICD-10-CM

## 2021-07-14 DIAGNOSIS — T783XXS Angioneurotic edema, sequela: Secondary | ICD-10-CM

## 2021-07-14 DIAGNOSIS — T783XXA Angioneurotic edema, initial encounter: Secondary | ICD-10-CM | POA: Insufficient documentation

## 2021-07-14 LAB — LIPID PANEL
Cholesterol: 257 mg/dL — ABNORMAL HIGH (ref 0–200)
HDL: 64.9 mg/dL (ref >39.00)
LDL Cholesterol: 178 mg/dL — ABNORMAL HIGH (ref 0–99)
NonHDL: 192.57
Total CHOL/HDL Ratio: 4
Triglycerides: 74 mg/dL (ref 0.0–149.0)
VLDL: 14.8 mg/dL (ref 0.0–40.0)

## 2021-07-14 LAB — VITAMIN D 25 HYDROXY (VIT D DEFICIENCY, FRACTURES): VITD: 22.12 ng/mL — ABNORMAL LOW (ref 30.00–100.00)

## 2021-07-14 LAB — CBC WITH DIFFERENTIAL/PLATELET
Basophils Absolute: 0.1 10*3/uL (ref 0.0–0.1)
Basophils Relative: 0.7 % (ref 0.0–3.0)
Eosinophils Absolute: 0 10*3/uL (ref 0.0–0.7)
Eosinophils Relative: 0.2 % (ref 0.0–5.0)
HCT: 38 % (ref 36.0–46.0)
Hemoglobin: 12.4 g/dL (ref 12.0–15.0)
Lymphocytes Relative: 33.4 % (ref 12.0–46.0)
Lymphs Abs: 3.8 10*3/uL (ref 0.7–4.0)
MCHC: 32.6 g/dL (ref 30.0–36.0)
MCV: 84.9 fl (ref 78.0–100.0)
Monocytes Absolute: 0.6 10*3/uL (ref 0.1–1.0)
Monocytes Relative: 5.2 % (ref 3.0–12.0)
Neutro Abs: 6.9 10*3/uL (ref 1.4–7.7)
Neutrophils Relative %: 60.5 % (ref 43.0–77.0)
Platelets: 367 10*3/uL (ref 150.0–400.0)
RBC: 4.48 Mil/uL (ref 3.87–5.11)
RDW: 15.6 % — ABNORMAL HIGH (ref 11.5–15.5)
WBC: 11.5 10*3/uL — ABNORMAL HIGH (ref 4.0–10.5)

## 2021-07-14 LAB — COMPREHENSIVE METABOLIC PANEL
ALT: 19 U/L (ref 0–35)
AST: 17 U/L (ref 0–37)
Albumin: 4.2 g/dL (ref 3.5–5.2)
Alkaline Phosphatase: 81 U/L (ref 39–117)
BUN: 12 mg/dL (ref 6–23)
CO2: 26 mEq/L (ref 19–32)
Calcium: 9.9 mg/dL (ref 8.4–10.5)
Chloride: 103 mEq/L (ref 96–112)
Creatinine, Ser: 0.87 mg/dL (ref 0.40–1.20)
GFR: 83.22 mL/min (ref >60.00)
Glucose, Bld: 87 mg/dL (ref 70–99)
Potassium: 3.6 mEq/L (ref 3.5–5.1)
Sodium: 138 mEq/L (ref 135–145)
Total Bilirubin: 0.3 mg/dL (ref 0.2–1.2)
Total Protein: 7.5 g/dL (ref 6.0–8.3)

## 2021-07-14 LAB — HEMOGLOBIN A1C: Hgb A1c MFr Bld: 5.8 % (ref 4.6–6.5)

## 2021-07-14 LAB — TSH: TSH: 0.97 u[IU]/mL (ref 0.35–5.50)

## 2021-07-14 MED ORDER — VALACYCLOVIR HCL 1 G PO TABS
ORAL_TABLET | Freq: Every day | ORAL | 0 refills | Status: DC
  Filled 2021-07-14: qty 90, 90d supply, fill #0

## 2021-07-14 MED ORDER — HYDROCHLOROTHIAZIDE 12.5 MG PO CAPS
12.5000 mg | ORAL_CAPSULE | Freq: Every day | ORAL | 0 refills | Status: DC | PRN
Start: 2021-07-14 — End: 2021-09-27
  Filled 2021-07-14: qty 90, 90d supply, fill #0

## 2021-07-14 MED ORDER — EPINEPHRINE 0.3 MG/0.3ML IJ SOAJ
0.3000 mg | INTRAMUSCULAR | 2 refills | Status: DC | PRN
  Filled 2021-07-14: qty 2, 30d supply, fill #0

## 2021-07-14 NOTE — Telephone Encounter (Signed)
Lft pt vm to call ofc . thanks 

## 2021-07-14 NOTE — Assessment & Plan Note (Addendum)
Symptom completely resolved at this time.  Benign exam today.  Etiology nonspecific.  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  No other identifiable triggers at this time. Stop nifedipine.  ? Provided her with an EpiPen as it seems this last episode was worse than previous.  She will continue to monitor for any triggers.  Referral to ENT for evaluation, allergy testing. Will follow.  ?

## 2021-07-14 NOTE — Telephone Encounter (Signed)
Pt returning call

## 2021-07-14 NOTE — Telephone Encounter (Signed)
Pt would a refill on  ?valACYclovir HCl 1000 MG ? ?Pharmacy is [facility] employee  ? ?Thank you! ?

## 2021-07-14 NOTE — Assessment & Plan Note (Signed)
H/o Gestational hypertension and was started on nifedipine 30 mg.  Blood pressure is improving with weight loss. .  We discussed stopping nifedipine  due to potential for angioedema.  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  ? I have sent in hydrochlorothiazide 12.5 mg to use if  blood pressure were to elevate. ?

## 2021-07-14 NOTE — Patient Instructions (Addendum)
Please call  and schedule your 3D mammogram at [facility] as soon as she can.  Please let me know of any issues in scheduling ? ?Please stop nifedipine.  ? ?  I have sent in hydrochlorothiazide 12.5 mg for you to use if blood pressure were to elevate greater than 120/80 consistently. ? ?I have also placed a referral to ENT.  I have also prescribed an EpiPen for you.  Please read instructions below ? ?Let us know if you dont hear back within a week in regards to an appointment being scheduled.  ? ?Health Maintenance, Female ?Adopting a healthy lifestyle and getting preventive care are important in promoting health and wellness. Ask your health care provider about: ?The right schedule for you to have regular tests and exams. ?Things you can do on your own to prevent diseases and keep yourself healthy. ?What should I know about diet, weight, and exercise? ?Eat a healthy diet ? ?Eat a diet that includes plenty of vegetables, fruits, low-fat dairy products, and lean protein. ?Do not eat a lot of foods that are high in solid fats, added sugars, or sodium. ?Maintain a healthy weight ?Body mass index (BMI) is used to identify weight problems. It estimates body fat based on height and weight. Your health care provider can help determine your BMI and help you achieve or maintain a healthy weight. ?Get regular exercise ?Get regular exercise. This is one of the most important things you can do for your health. Most adults should: ?Exercise for at least 150 minutes each week. The exercise should increase your heart rate and make you sweat (moderate-intensity exercise). ?Do strengthening exercises at least twice a week. This is in addition to the moderate-intensity exercise. ?Spend less time sitting. Even light physical activity can be beneficial. ?Watch cholesterol and blood lipids ?Have your blood tested for lipids and cholesterol at 41 years of age, then have this test every 5 years. ?Have your cholesterol levels  checked more often if: ?Your lipid or cholesterol levels are high. ?You are older than 41 years of age. ?You are at high risk for heart disease. ?What should I know about cancer screening? ?Depending on your health history and family history, you may need to have cancer screening at various ages. This may include screening for: ?Breast cancer. ?Cervical cancer. ?Colorectal cancer. ?Skin cancer. ?Lung cancer. ?What should I know about heart disease, diabetes, and high blood pressure? ?Blood pressure and heart disease ?High blood pressure causes heart disease and increases the risk of stroke. This is more likely to develop in people who have high blood pressure readings or are overweight. ?Have your blood pressure checked: ?Every 3-5 years if you are 19-56 years of age. ?Every year if you are 20 years old or older. ?Diabetes ?Have regular diabetes screenings. This checks your fasting blood sugar level. Have the screening done: ?Once every three years after age 42 if you are at a normal weight and have a low risk for diabetes. ?More often and at a younger age if you are overweight or have a high risk for diabetes. ?What should I know about preventing infection? ?Hepatitis B ?If you have a higher risk for hepatitis B, you should be screened for this virus. Talk with your health care provider to find out if you are at risk for hepatitis B infection. ?Hepatitis C ?Testing is recommended for: ?Everyone born from [birth year] through 1965. ?Anyone with known risk factors for hepatitis C. ?Sexually transmitted infections (STIs) ?Get screened for STIs,  including gonorrhea and chlamydia, if: ?You are sexually active and are younger than 41 years of age. ?You are older than 41 years of age and your health care provider tells you that you are at risk for this type of infection. ?Your sexual activity has changed since you were last screened, and you are at increased risk for chlamydia or gonorrhea. Ask your health care provider if you  are at risk. ?Ask your health care provider about whether you are at high risk for HIV. Your health care provider may recommend a prescription medicine to help prevent HIV infection. If you choose to take medicine to prevent HIV, you should first get tested for HIV. You should then be tested every 3 months for as long as you are taking the medicine. ?Pregnancy ?If you are about to stop having your period (premenopausal) and you may become pregnant, seek counseling before you get pregnant. ?Take 400 to 800 micrograms (mcg) of folic acid every day if you become pregnant. ?Ask for birth control (contraception) if you want to prevent pregnancy. ?Osteoporosis and menopause ?Osteoporosis is a disease in which the bones lose minerals and strength with aging. This can result in bone fractures. If you are 57 years old or older, or if you are at risk for osteoporosis and fractures, ask your health care provider if you should: ?Be screened for bone loss. ?Take a calcium or vitamin D supplement to lower your risk of fractures. ?Be given hormone replacement therapy (HRT) to treat symptoms of menopause. ?Follow these instructions at home: ?Alcohol use ?Do not drink alcohol if: ?Your health care provider tells you not to drink. ?You are pregnant, may be pregnant, or are planning to become pregnant. ?If you drink alcohol: ?Limit how much you have to: ?0-1 drink a day. ?Know how much alcohol is in your drink. In the [place] one drink equals one 12 oz bottle of beer (355 mL), one 5 oz glass of wine (148 mL), or one 1? oz glass of hard liquor (44 mL). ?Lifestyle ?Do not use any products that contain nicotine or tobacco. These products include cigarettes, chewing tobacco, and vaping devices, such as e-cigarettes. If you need help quitting, ask your health care provider. ?Do not use street drugs. ?Do not share needles. ?Ask your health care provider for help if you need support or information about quitting drugs. ?General  instructions ?Schedule regular health, dental, and eye exams. ?Stay current with your vaccines. ?Tell your health care provider if: ?You often feel depressed. ?You have ever been abused or do not feel safe at home. ?Summary ?Adopting a healthy lifestyle and getting preventive care are important in promoting health and wellness. ?Follow your health care provider's instructions about healthy diet, exercising, and getting tested or screened for diseases. ?Follow your health care provider's instructions on monitoring your cholesterol and blood pressure. ?This information is not intended to replace advice given to you by your health care provider. Make sure you discuss any questions you have with your health care provider. ?Document Revised: 08/24/2020 Document Reviewed: 08/24/2020 ?Elsevier Patient Education ? [place]. ?How to Use an Auto-Injector Pen ?An auto-injector pen (pre-filled automatic epinephrine injection device) is a device that is used to deliver epinephrine to the body. Epinephrine is a medicine that is given as a shot (injection). It works by relaxing the muscles in the airways and tightening the blood vessels. It is used to treat: ?A life-threatening allergic reaction (anaphylaxis). ?Serious breathing problems, such as severe asthma attacks, some  lung problems, and other emergency conditions. ?An epinephrine injection can save your life. You should always carry an auto-injector pen with you if you are at risk for severe asthma attacks or anaphylaxis. You may hear other names for an auto-injector pen. They are epinephrine injection, epinephrine auto-injector pen, epinephrine pen, and automatic injection device. ?What are the risks? ?Using the auto-injector pen is safe. However, problems may arise, including: ?Damage to bone or tissue. Make sure that you correctly place the needle in the muscle of your outer thigh as told by your health care provider. ?When should I use my auto-injector pen? ?Use  your auto-injector pen as soon as you think you are experiencing anaphylaxis or a severe asthma attack. Anaphylaxis is very dangerous if it is not treated right away. ?Signs and symptoms of anaphylaxis may include: ?Feeli

## 2021-07-14 NOTE — Assessment & Plan Note (Signed)
Pap smear is up-to-date and she continues to follow with GYN.  Clinical breast exam performed and diagnostic mammogram has been ordered. ?

## 2021-07-14 NOTE — Progress Notes (Signed)
? ?Subjective:  ? ? Patient ID: Angie Tucker, female    DOB: 1980-06-21, 41 y.o.   MRN: [id number] ? ?CC: Angie Tucker is a 41 y.o. female who presents today for physical exam.   ? ?HPI: Lips swelling episode this past week, 1 month ago and 3 months ago. Episodes occurred at home and not after food indigestion. Occurred in middle of night. She has been taking pepcid and zyrtec along with prednisone with complete resolution. This past episode was the most severe swelling she has had to date with both lips.  ?Would randomly happen in highschool. Will start out itching then feel numb.  When lips are swollen, sometimes it is top or bottom lip.  ?No related to food.  No drooling, shortness of breath trouble swallowing, rash when episode occurs.  ? ?No h/o asthma ? ? ?She has h/o eczema. Following with dermatology; biopsy proven ? ?Anxiety and depression-compliant with Zoloft 100 mg and feels medication works well for her ? ?HTN- pregnancy induced HTN- compliant with nifepine 40m.  She feels blood pressure is improving, especially as she is lost significant amount of weight since pregnancy ? ?Colorectal Cancer Screening: No first-degree relative with colon cancer ?Breast Cancer Screening: Mammogram due for diagnostic bilateral mammogram and right breast ultrasound ? ?Cervical Cancer Screening: UTD,  10/2019, follows with GYN ? ? ? ? ?      Tetanus - UTD ? ? ?Labs: Screening labs today. ?Exercise: Gets regular exercise.   ?Alcohol use: Occasionally ?Smoking/tobacco use: Nonsmoker.   ? ? ?HISTORY:  ?Past Medical History:  ?Diagnosis Date  ? Anxiety   ? BRCA negative 03/2018  ? MyRisk neg  ? Broken wrist Right Wrist  ? Depression   ? Depression   ? Family history of BRCA gene positive   ? PGM with ovarian cancer  ? Family history of breast cancer   ? Family history of ovarian cancer   ? Family history of pancreatic cancer   ? Herpes   ? Pregnancy induced hypertension   ?  ?Past Surgical History:  ?Procedure  Laterality Date  ? CESAREAN SECTION N/A 02/21/2021  ? Procedure: Primary CESAREAN SECTION;  Surgeon: TBrien Few MD;  Location: [facility];  Service: Obstetrics;  Laterality: N/A;  EDD: 03/03/21  ? FRACTURE SURGERY    ? WRIST SURGERY  2011  ? WRIST SURGERY Left   ? ?Family History  ?Problem Relation Age of Onset  ? Hyperlipidemia Mother   ? Hypertension Mother   ? Cancer Mother   ?     pancreatic and breast  ? Breast cancer Mother 376 ? Pancreatic cancer Mother 550 ? Depression Mother   ? Anxiety disorder Mother   ? Alcohol abuse Mother   ? Ovarian cancer Paternal Grandmother [phone] ? Uterine cancer Paternal Grandmother 712 ?     BRCA positive  ? Breast cancer Paternal Grandmother 629 ? Kidney cancer Paternal Grandfather 78 ? Multiple myeloma Maternal Grandmother 713 ? Pancreatic cancer Paternal Great-grandmother   ? Pancreatic cancer Other   ? ?  ? ?ALLERGIES: Food and Amoxicillin ? ?Current Outpatient Medications on File Prior to Visit  ?Medication Sig Dispense Refill  ? etonogestrel-ethinyl estradiol (NUVARING) 0.12-0.015 MG/24HR vaginal ring Insert 1 vaginal ring every month by vaginal route. 3 each 3  ? sertraline (ZOLOFT) 100 MG tablet TAKE 1 TABLET (100 MG TOTAL) BY MOUTH AT BEDTIME. 90 tablet 3  ? triamcinolone ointment (KENALOG) 0.1 % Apply 1  application topically 2 (two) times daily as needed (skin irritation/ezcema).    ? ibuprofen (ADVIL) 600 MG tablet Take 1 tablet (600 mg total) by mouth every 6 (six) hours. 30 tablet 0  ? mupirocin ointment (BACTROBAN) 2 % Apply to biopsy site once daily 22 g 0  ? predniSONE (DELTASONE) 20 MG tablet Take 1 tablet by mouth once daily for 10 days 10 tablet 2  ? Prenatal MV-Min-Fe Fum-FA-DHA (PRENATAL+DHA PO) Take 1 tablet by mouth in the morning.    ? triamcinolone ointment (KENALOG) 0.1 % Apply topically to affected areas twice daily as needed for itch  ? Avoid applying to face, groin, and axilla. Use as directed. 80 g 1  ? [DISCONTINUED] misoprostol (CYTOTEC) 200 MCG  tablet Place four tablets in between your gums and cheeks (two tablets on each side) as instructed OR insert four tablets vaginally, repeat every 6-hrs as needed (Patient not taking: Reported on 04/13/2020) 12 tablet 0  ? ?No current facility-administered medications on file prior to visit.  ? ? ?Social History  ? ?Tobacco Use  ? Smoking status: Never  ? Smokeless tobacco: Never  ?Vaping Use  ? Vaping Use: Never used  ?Substance Use Topics  ? Alcohol use: Not Currently  ?  Comment: occasionally  ? Drug use: Never  ? ? ?Review of Systems  ?Constitutional:  Negative for chills, fever and unexpected weight change.  ?HENT:  Negative for congestion.   ?Respiratory:  Negative for cough and shortness of breath.   ?Cardiovascular:  Negative for chest pain, palpitations and leg swelling.  ?Gastrointestinal:  Negative for nausea and vomiting.  ?Musculoskeletal:  Negative for arthralgias and myalgias.  ?Skin:  Negative for rash.  ?Neurological:  Negative for headaches.  ?Hematological:  Negative for adenopathy.  ?Psychiatric/Behavioral:  Negative for confusion.   ?   ?Objective:  ?  ?BP 118/68 (BP Location: Left Arm, Patient Position: Sitting, Cuff Size: Large)   Pulse 66   Temp (!) 97.5 ?F (36.4 ?C) (Oral)   Ht _0  (1.575 m)   Wt 208 lb 9.6 oz (94.6 kg)   LMP 07/01/2021 (Exact Date)   SpO2 99%   Breastfeeding No   BMI 38.15 kg/m?  ? ?BP Readings from Last 3 Encounters:  ?07/14/21 118/68  ?02/23/21 128/80  ?09/08/20 118/68  ? ?Wt Readings from Last 3 Encounters:  ?07/14/21 208 lb 9.6 oz (94.6 kg)  ?02/21/21 247 lb 12.8 oz (112.4 kg)  ?02/18/21 248 lb (112.5 kg)  ? ? ?Physical Exam ?Vitals reviewed.  ?Constitutional:   ?   Appearance: Normal appearance. She is well-developed.  ?HENT:  ?   Mouth/Throat:  ?   Mouth: Mucous membranes are moist. No angioedema.  ?   Pharynx: No pharyngeal swelling, oropharyngeal exudate, posterior oropharyngeal erythema or uvula swelling.  ?Eyes:  ?   Conjunctiva/sclera: Conjunctivae  normal.  ?Neck:  ?   Thyroid: No thyroid mass or thyromegaly.  ?Cardiovascular:  ?   Rate and Rhythm: Normal rate and regular rhythm.  ?   Pulses: Normal pulses.  ?   Heart sounds: Normal heart sounds.  ?Pulmonary:  ?   Effort: Pulmonary effort is normal.  ?   Breath sounds: Normal breath sounds. No wheezing, rhonchi or rales.  ?Chest:  ?Breasts: ?   Breasts are symmetrical.  ?   Right: No inverted nipple, mass, nipple discharge, skin change or tenderness.  ?   Left: No inverted nipple, mass, nipple discharge, skin change or tenderness.  ?Abdominal:  ?  General: Bowel sounds are normal. There is no distension.  ?   Palpations: Abdomen is soft. Abdomen is not rigid. There is no fluid wave or mass.  ?   Tenderness: There is no abdominal tenderness. There is no guarding or rebound.  ?Lymphadenopathy:  ?   Head:  ?   Right side of head: No submental, submandibular, tonsillar, preauricular, posterior auricular or occipital adenopathy.  ?   Left side of head: No submental, submandibular, tonsillar, preauricular, posterior auricular or occipital adenopathy.  ?   Cervical: No cervical adenopathy.  ?   Right cervical: No superficial, deep or posterior cervical adenopathy. ?   Left cervical: No superficial, deep or posterior cervical adenopathy.  ?Skin: ?   General: Skin is warm and dry.  ?Neurological:  ?   Mental Status: She is alert.  ?Psychiatric:     ?   Speech: Speech normal.     ?   Behavior: Behavior normal.     ?   Thought Content: Thought content normal.  ? ? ?   ?Assessment & Plan:  ? ?Problem List Items Addressed This Visit   ? ?  ? Cardiovascular and Mediastinum  ? Hypertension affecting pregnancy  ?  H/o Gestational hypertension and was started on nifedipine 30 mg.  Blood pressure is improving with weight loss. .  We discussed stopping nifedipine  due to potential for angioedema.  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  ? I have  sent in hydrochlorothiazide 12.5 mg to use if  blood pressure were to elevate. ?  ?  ? Relevant Medications  ? EPINEPHrine 0.3 mg/0.3 mL IJ SOAJ injection  ? hydrochlorothiazide (MICROZIDE) 12.5 MG capsule  ?  ? Other

## 2021-07-14 NOTE — Assessment & Plan Note (Signed)
Chronic, stable.  Continue Zoloft 100 mg ?

## 2021-07-15 ENCOUNTER — Other Ambulatory Visit: Payer: Self-pay

## 2021-07-15 DIAGNOSIS — B009 Herpesviral infection, unspecified: Secondary | ICD-10-CM

## 2021-07-15 LAB — HEPATITIS C ANTIBODY
Hepatitis C Ab: NONREACTIVE
SIGNAL TO CUT-OFF: <0.02 (ref <1.00)

## 2021-07-15 MED ORDER — VALACYCLOVIR HCL 1 G PO TABS
ORAL_TABLET | Freq: Every day | ORAL | 0 refills | Status: DC
  Filled 2021-07-15: qty 90, fill #0
  Filled 2021-09-23: qty 90, 90d supply, fill #0

## 2021-07-15 NOTE — Telephone Encounter (Signed)
LM that prescription has been sent in to pharmacy. ?

## 2021-07-18 ENCOUNTER — Other Ambulatory Visit: Payer: Self-pay | Admitting: Family

## 2021-07-18 DIAGNOSIS — R899 Unspecified abnormal finding in specimens from other organs, systems and tissues: Secondary | ICD-10-CM

## 2021-08-10 ENCOUNTER — Encounter: Payer: Self-pay | Admitting: Family

## 2021-08-13 ENCOUNTER — Other Ambulatory Visit: Payer: Self-pay | Admitting: Family

## 2021-08-13 DIAGNOSIS — T783XXS Angioneurotic edema, sequela: Secondary | ICD-10-CM

## 2021-08-18 ENCOUNTER — Ambulatory Visit
Admission: RE | Admit: 2021-08-18 | Discharge: 2021-08-18 | Disposition: A | Discharge status: Home / Self Care | Payer: 59 | Source: Ambulatory Visit | Attending: Family | Admitting: Family

## 2021-08-18 ENCOUNTER — Encounter: Payer: Self-pay | Admitting: Family

## 2021-08-18 DIAGNOSIS — N6312 Unspecified lump in the right breast, upper inner quadrant: Secondary | ICD-10-CM | POA: Diagnosis not present

## 2021-08-18 DIAGNOSIS — Z0000 Encounter for general adult medical examination without abnormal findings: Secondary | ICD-10-CM

## 2021-08-18 DIAGNOSIS — R922 Inconclusive mammogram: Secondary | ICD-10-CM | POA: Diagnosis not present

## 2021-08-18 DIAGNOSIS — N6311 Unspecified lump in the right breast, upper outer quadrant: Secondary | ICD-10-CM | POA: Diagnosis not present

## 2021-08-22 ENCOUNTER — Other Ambulatory Visit: Payer: Self-pay | Admitting: Family

## 2021-08-22 DIAGNOSIS — Z1371 Encounter for nonprocreative screening for genetic disease carrier status: Secondary | ICD-10-CM

## 2021-08-22 DIAGNOSIS — Z9189 Other specified personal risk factors, not elsewhere classified: Secondary | ICD-10-CM

## 2021-09-07 ENCOUNTER — Ambulatory Visit: Payer: 59 | Admitting: Family

## 2021-09-07 ENCOUNTER — Other Ambulatory Visit: Payer: Self-pay

## 2021-09-07 MED ORDER — DIAZEPAM 5 MG PO TABS
5.0000 mg | ORAL_TABLET | ORAL | 0 refills | Status: DC
  Filled 2021-09-07: qty 20, 5d supply, fill #0

## 2021-09-23 ENCOUNTER — Other Ambulatory Visit: Payer: Self-pay

## 2021-09-23 ENCOUNTER — Encounter: Payer: Self-pay | Admitting: Family

## 2021-09-24 ENCOUNTER — Other Ambulatory Visit: Payer: Self-pay

## 2021-09-24 DIAGNOSIS — B009 Herpesviral infection, unspecified: Secondary | ICD-10-CM

## 2021-09-24 MED ORDER — VALACYCLOVIR HCL 1 G PO TABS
ORAL_TABLET | Freq: Every day | ORAL | 0 refills | Status: DC
  Filled 2021-09-24: qty 90, 90d supply, fill #0

## 2021-09-24 MED FILL — Sertraline HCl Tab 100 MG: ORAL | 90 days supply | Qty: 90 | Fill #0 | Status: AC

## 2021-09-27 ENCOUNTER — Telehealth (INDEPENDENT_AMBULATORY_CARE_PROVIDER_SITE_OTHER): Payer: 59 | Admitting: Family

## 2021-09-27 ENCOUNTER — Encounter: Payer: Self-pay | Admitting: Family

## 2021-09-27 VITALS — Ht 62.0 in | Wt 208.1 lb

## 2021-09-27 DIAGNOSIS — R197 Diarrhea, unspecified: Secondary | ICD-10-CM | POA: Diagnosis not present

## 2021-09-27 NOTE — Patient Instructions (Signed)
I have ordered this, screen, stool specimen.  You may have this done at [facility].  Please call me with any concerns  Start metamucil to bulk stool 3.4 g daily.   Referral to GI Let us know if you dont hear back within a week in regards to an appointment being scheduled.    Low-FODMAP Eating Plan  FODMAP stands for fermentable oligosaccharides, disaccharides, monosaccharides, and polyols. These are sugars that are hard for some people to digest. A low-FODMAP eating plan may help some people who have irritable bowel syndrome (IBS) and certain other bowel (intestinal) diseases to manage their symptoms. This meal plan can be complicated to follow. Work with a diet and nutrition specialist (dietitian) to make a low-FODMAP eating plan that is right for you. A dietitian can help make sure that you get enough nutrition from this diet. What are tips for following this plan? Reading food labels Check labels for hidden FODMAPs such as: High-fructose syrup. Honey. Agave. Natural fruit flavors. Onion or garlic powder. Choose low-FODMAP foods that contain 3-4 grams of fiber per serving. Check food labels for serving sizes. Eat only one serving at a time to make sure FODMAP levels stay low. Shopping Shop with a list of foods that are recommended on this diet and make a meal plan. Meal planning Follow a low-FODMAP eating plan for up to 6 weeks, or as told by your health care provider or dietitian. To follow the eating plan: Eliminate high-FODMAP foods from your diet completely. Choose only low-FODMAP foods to eat. You will do this for 2-6 weeks. Gradually reintroduce high-FODMAP foods into your diet one at a time. Most people should wait a few days before introducing the next new high-FODMAP food into their meal plan. Your dietitian can recommend how quickly you may reintroduce foods. Keep a daily record of what and how much you eat and drink. Make note of any symptoms that you have after  eating. Review your daily record with a dietitian regularly to identify which foods you can eat and which foods you should avoid. General tips Drink enough fluid each day to keep your urine pale yellow. Avoid processed foods. These often have added sugar and may be high in FODMAPs. Avoid most dairy products, whole grains, and sweeteners. Work with a dietitian to make sure you get enough fiber in your diet. Avoid high FODMAP foods at meals to manage symptoms. Recommended foods Fruits Bananas, oranges, tangerines, lemons, limes, blueberries, raspberries, strawberries, grapes, cantaloupe, honeydew melon, kiwi, papaya, passion fruit, and pineapple. Limited amounts of dried cranberries, banana chips, and shredded coconut. Vegetables Eggplant, zucchini, cucumber, peppers, green beans, bean sprouts, lettuce, arugula, kale, Swiss chard, spinach, collard greens, bok choy, summer squash, potato, and tomato. Limited amounts of corn, carrot, and sweet potato. Green parts of scallions. Grains Gluten-free grains, such as rice, oats, buckwheat, quinoa, corn, polenta, and millet. Gluten-free pasta, bread, or cereal. Rice noodles. Corn tortillas. Meats and other proteins Unseasoned beef, pork, poultry, or fish. Eggs. Berniece Salines. Tofu (firm) and tempeh. Limited amounts of nuts and seeds, such as almonds, walnuts, [place] nuts, pecans, peanuts, nut butters, pumpkin seeds, chia seeds, and sunflower seeds. Dairy Lactose-free milk, yogurt, and kefir. Lactose-free cottage cheese and ice cream. Non-dairy milks, such as almond, coconut, hemp, and rice milk. Non-dairy yogurt. Limited amounts of goat cheese, brie, mozzarella, parmesan, swiss, and other hard cheeses. Fats and oils Butter-free spreads. Vegetable oils, such as olive, canola, and sunflower oil. Seasoning and other foods Artificial sweeteners with  names that do not end in "ol," such as aspartame, saccharine, and stevia. Maple syrup, white table sugar, raw sugar,  brown sugar, and molasses. Mayonnaise, soy sauce, and tamari. Fresh basil, coriander, parsley, rosemary, and thyme. Beverages Water and mineral water. Sugar-sweetened soft drinks. Small amounts of orange juice or cranberry juice. Black and green tea. Most dry wines. Coffee. The items listed above may not be a complete list of foods and beverages you can eat. Contact a dietitian for more information. Foods to avoid Fruits Fresh, dried, and juiced forms of apple, pear, watermelon, peach, plum, cherries, apricots, blackberries, boysenberries, figs, nectarines, and mango. Avocado. Vegetables Chicory root, artichoke, asparagus, cabbage, snow peas, Brussels sprouts, broccoli, sugar snap peas, mushrooms, celery, and cauliflower. Onions, garlic, leeks, and the white part of scallions. Grains Wheat, including kamut, durum, and semolina. Barley and bulgur. Couscous. Wheat-based cereals. Wheat noodles, bread, crackers, and pastries. Meats and other proteins Fried or fatty meat. Sausage. Cashews and pistachios. Soybeans, baked beans, black beans, chickpeas, kidney beans, fava beans, navy beans, lentils, black-eyed peas, and split peas. Dairy Milk, yogurt, ice cream, and soft cheese. Cream and sour cream. Milk-based sauces. Custard. Buttermilk. Soy milk. Seasoning and other foods Any sugar-free gum or candy. Foods that contain artificial sweeteners such as sorbitol, mannitol, isomalt, or xylitol. Foods that contain honey, high-fructose corn syrup, or agave. Bouillon, vegetable stock, beef stock, and chicken stock. Garlic and onion powder. Condiments made with onion, such as hummus, chutney, pickles, relish, salad dressing, and salsa. Tomato paste. Beverages Chicory-based drinks. Coffee substitutes. Chamomile tea. Fennel tea. Sweet or fortified wines such as port or sherry. Diet soft drinks made with isomalt, mannitol, maltitol, sorbitol, or xylitol. Apple, pear, and mango juice. Juices with high-fructose corn  syrup. The items listed above may not be a complete list of foods and beverages you should avoid. Contact a dietitian for more information. Summary FODMAP stands for fermentable oligosaccharides, disaccharides, monosaccharides, and polyols. These are sugars that are hard for some people to digest. A low-FODMAP eating plan is a short-term diet that helps to ease symptoms of certain bowel diseases. The eating plan usually lasts up to 6 weeks. After that, high-FODMAP foods are reintroduced gradually and one at a time. This can help you find out which foods may be causing symptoms. A low-FODMAP eating plan can be complicated. It is best to work with a dietitian who has experience with this type of plan. This information is not intended to replace advice given to you by your health care provider. Make sure you discuss any questions you have with your health care provider. Document Revised: 08/22/2019 Document Reviewed: 08/22/2019 Elsevier Patient Education  [place].

## 2021-09-27 NOTE — Progress Notes (Signed)
Virtual Visit via Video Note  I connected with@  on 09/27/21 at  4:00 PM EDT by a video enabled telemedicine application and verified that I am speaking with the correct person using two identifiers.  Location patient: home Location provider:work  Persons participating in the virtual visit: patient, provider  I discussed the limitations of evaluation and management by telemedicine and the availability of in person appointments. The patient expressed understanding and agreed to proceed.   HPI: Complains of fecal urgency, more frequently past several weeks. She noticed symptom several months ago Can have loose brown stool versus watery brown stool. She has seen white mucous on stool. Episode can occur before a meal and can have occur after a meal.  She is working on weight loss.  No unintentional weight loss, abdominal bloating.No hemorrhoids. No blood in stool.  No recent antibiotics.  Drinks diet dr pepper.  No h/o lactose intolerance.      She has 91 month old daughter    increased risk of breast cancer.  Due for bilateral breast MRI November 2023  ROS: See pertinent positives and negatives per HPI.    EXAM:  VITALS per patient if applicable: Ht 5\' 2"  (1.575 m)   Wt 208 lb 1.6 oz (94.4 kg)   LMP  (LMP Unknown)   BMI 38.06 kg/m  BP Readings from Last 3 Encounters:  07/14/21 118/68  02/23/21 128/80  09/08/20 118/68   Wt Readings from Last 3 Encounters:  09/27/21 208 lb 1.6 oz (94.4 kg)  07/14/21 208 lb 9.6 oz (94.6 kg)  02/21/21 247 lb 12.8 oz (112.4 kg)    GENERAL: alert, oriented, appears well and in no acute distress  HEENT: atraumatic, conjunttiva clear, no obvious abnormalities on inspection of external nose and ears  NECK: normal movements of the head and neck  LUNGS: on inspection no signs of respiratory distress, breathing rate appears normal, no obvious gross SOB, gasping or wheezing  CV: no obvious cyanosis  MS: moves all visible extremities  without noticeable abnormality  PSYCH/NEURO: pleasant and cooperative, no obvious depression or anxiety, speech and thought processing grossly intact  ASSESSMENT AND PLAN:  Discussed the following assessment and plan:  Problem List Items Addressed This Visit       Other   Diarrhea - Primary    Acute on chronic. Duration several months. Differential includes celiac disease, lactose intolerance, IBS. Pending labs, stool culture. We agreed consult with gastroenterology for further evaluation. She will trial metamucil. Counseled her on artificial sugar , FOD MAP diet.  She will let me know how she is doing      Relevant Orders   Ambulatory referral to Gastroenterology   CBC with Differential/Platelet   Stool culture   Calprotectin, Fecal   Celiac Disease Panel   C-reactive protein   TSH   GI pathogen panel by PCR, stool    -we discussed possible serious and likely etiologies, options for evaluation and workup, limitations of telemedicine visit vs in person visit, treatment, treatment risks and precautions. Pt prefers to treat via telemedicine empirically rather then risking or undertaking an in person visit at this moment.  .   I discussed the assessment and treatment plan with the patient. The patient was provided an opportunity to ask questions and all were answered. The patient agreed with the plan and demonstrated an understanding of the instructions.   The patient was advised to call back or seek an in-person evaluation if the symptoms worsen or if the condition  fails to improve as anticipated.   [name], FNP

## 2021-09-27 NOTE — Assessment & Plan Note (Signed)
Acute on chronic. Duration several months. Differential includes celiac disease, lactose intolerance, IBS. Pending labs, stool culture. We agreed consult with gastroenterology for further evaluation. She will trial metamucil. Counseled her on artificial sugar , FOD MAP diet.  She will let me know how she is doing

## 2021-09-28 ENCOUNTER — Other Ambulatory Visit
Admission: RE | Admit: 2021-09-28 | Discharge: 2021-09-28 | Disposition: A | Discharge status: Home / Self Care | Payer: 59 | Attending: Family | Admitting: Family

## 2021-09-28 DIAGNOSIS — R197 Diarrhea, unspecified: Secondary | ICD-10-CM | POA: Insufficient documentation

## 2021-09-28 LAB — GI PATHOGEN PANEL BY PCR, STOOL

## 2021-09-28 LAB — CBC WITH DIFFERENTIAL/PLATELET
Abs Immature Granulocytes: 0.02 10*3/uL (ref 0.00–0.07)
Basophils Absolute: 0 10*3/uL (ref 0.0–0.1)
Basophils Relative: 1 %
Eosinophils Absolute: 0.1 10*3/uL (ref 0.0–0.5)
Eosinophils Relative: 1 %
HCT: 38.6 % (ref 36.0–46.0)
Hemoglobin: 12.9 g/dL (ref 12.0–15.0)
Immature Granulocytes: 0 %
Lymphocytes Relative: 30 %
Lymphs Abs: 2 10*3/uL (ref 0.7–4.0)
MCH: 27.8 pg (ref 26.0–34.0)
MCHC: 33.4 g/dL (ref 30.0–36.0)
MCV: 83.2 fL (ref 80.0–100.0)
Monocytes Absolute: 0.3 10*3/uL (ref 0.1–1.0)
Monocytes Relative: 4 %
Neutro Abs: 4.3 10*3/uL (ref 1.7–7.7)
Neutrophils Relative %: 64 %
Platelets: 355 10*3/uL (ref 150–400)
RBC: 4.64 MIL/uL (ref 3.87–5.11)
RDW: 12.9 % (ref 11.5–15.5)
WBC: 6.7 10*3/uL (ref 4.0–10.5)
nRBC: 0 % (ref 0.0–0.2)

## 2021-09-28 LAB — TSH: TSH: 0.659 u[IU]/mL (ref 0.350–4.500)

## 2021-09-28 LAB — C-REACTIVE PROTEIN: CRP: 1 mg/dL — ABNORMAL HIGH (ref <1.0)

## 2021-09-30 LAB — CELIAC DISEASE PANEL
Endomysial Ab, IgA: NEGATIVE
IgA: 221 mg/dL (ref 87–352)
Tissue Transglutaminase Ab, IgA: <2 U/mL (ref 0–3)

## 2021-10-02 LAB — STOOL CULTURE REFLEX - CMPCXR

## 2021-10-02 LAB — STOOL CULTURE: E coli, Shiga toxin Assay: NEGATIVE

## 2021-10-02 LAB — STOOL CULTURE REFLEX - RSASHR

## 2021-10-05 ENCOUNTER — Other Ambulatory Visit: Payer: Self-pay

## 2021-10-05 ENCOUNTER — Ambulatory Visit: Payer: 59 | Admitting: Gastroenterology

## 2021-10-05 NOTE — Progress Notes (Deleted)
Gastroenterology Consultation  Referring Provider:     Burnard Hawthorne, FNP Primary Care Physician:  Burnard Hawthorne, FNP Primary Gastroenterologist:  Dr. Allen Norris     Reason for Consultation:     Diarrhea        HPI:   [name] is a 41 y.o. y/o female referred for consultation & management of diarrhea by Dr. Vidal Schwalbe, Yvetta Coder, FNP.  This patient comes in today with a report of diarrhea.  The patient was seen by her primary care provider who sent off a celiac sprue panel which was negative, and a GI panel which was negative and a CRP that was borderline high at 1.0.  The normal is less than 1.0.  The patient had a normal CBC.  Past Medical History:  Diagnosis Date   Anxiety    BRCA negative 03/2018   MyRisk neg   Broken wrist Right Wrist   Depression    Depression    Family history of BRCA gene positive    PGM with ovarian cancer   Family history of breast cancer    Family history of ovarian cancer    Family history of pancreatic cancer    Herpes    Pregnancy induced hypertension     Past Surgical History:  Procedure Laterality Date   CESAREAN SECTION N/A 02/21/2021   Procedure: Primary CESAREAN SECTION;  Surgeon: Brien Few, MD;  Location: [facility];  Service: Obstetrics;  Laterality: N/A;  EDD: 03/03/21   FRACTURE SURGERY     WRIST SURGERY  2011   WRIST SURGERY Left     Prior to Admission medications   Medication Sig Start Date End Date Taking? Authorizing Provider  EPINEPHrine 0.3 mg/0.3 mL IJ SOAJ injection Inject 0.3 mg into the muscle as needed for anaphylaxis. 07/14/21   Burnard Hawthorne, FNP  etonogestrel-ethinyl estradiol (NUVARING) 0.12-0.015 MG/24HR vaginal ring Insert 1 vaginal ring every month by vaginal route. 04/07/21     ibuprofen (ADVIL) 600 MG tablet Take 1 tablet (600 mg total) by mouth every 6 (six) hours. 02/23/21   Azucena Fallen, MD  predniSONE (DELTASONE) 20 MG tablet Take 1 tablet by mouth once daily for 10 days 06/02/21      Prenatal MV-Min-Fe Fum-FA-DHA (PRENATAL+DHA PO) Take 1 tablet by mouth in the morning.    [provider]  sertraline (ZOLOFT) 100 MG tablet TAKE 1 TABLET (100 MG TOTAL) BY MOUTH AT BEDTIME. 07/09/21   Burnard Hawthorne, FNP  triamcinolone ointment (KENALOG) 0.1 % Apply topically to affected areas twice daily as needed for itch   Avoid applying to face, groin, and axilla. Use as directed. Patient not taking: Reported on 09/27/2021 07/27/20   Laurence Ferrari, [place], MD  valACYclovir (VALTREX) 1000 MG tablet TAKE 1 TABLET BY MOUTH DAILY. 09/24/21 09/24/22  Burnard Hawthorne, FNP  misoprostol (CYTOTEC) 200 MCG tablet Place four tablets in between your gums and cheeks (two tablets on each side) as instructed OR insert four tablets vaginally, repeat every 6-hrs as needed Patient not taking: Reported on 04/13/2020 03/26/20 07/17/20  Orlie Pollen, CNM    Family History  Problem Relation Age of Onset   Hyperlipidemia Mother    Hypertension Mother    Cancer Mother        pancreatic and breast   Breast cancer Mother 16   Pancreatic cancer Mother 58   Depression Mother    Anxiety disorder Mother    Alcohol abuse Mother    Ovarian cancer Paternal Grandmother  78   Uterine cancer Paternal Grandmother 37       BRCA positive   Breast cancer Paternal Grandmother 66   Kidney cancer Paternal Grandfather 48   Multiple myeloma Maternal Grandmother 75   Pancreatic cancer Paternal Great-grandmother    Pancreatic cancer Other      Social History   Tobacco Use   Smoking status: Never   Smokeless tobacco: Never  Vaping Use   Vaping Use: Never used  Substance Use Topics   Alcohol use: Not Currently    Comment: occasionally   Drug use: Never    Allergies as of 10/05/2021 - Review Complete 07/14/2021  Allergen Reaction Noted   Food Swelling 02/18/2021   Amoxicillin Rash 06/04/2017    Review of Systems:    All systems reviewed and negative except where noted in HPI.   Physical Exam:  LMP  (LMP  Unknown)  No LMP recorded (lmp unknown). General:   Alert,  Well-developed, ***well-nourished, pleasant and cooperative in NAD Head:  Normocephalic and atraumatic. Eyes:  Sclera clear, no icterus.   Conjunctiva pink. Ears:  Normal auditory acuity. Neck:  Supple; no masses or thyromegaly. Lungs:  Respirations even and unlabored.  Clear throughout to auscultation.   No wheezes, crackles, or rhonchi. No acute distress. Heart:  Regular rate and rhythm; no murmurs, clicks, rubs, or gallops. Abdomen:  Normal bowel sounds.  No bruits.  Soft, non-tender and non-distended without masses, hepatosplenomegaly or hernias noted.  No guarding or rebound tenderness.  ***Negative Carnett sign.   Rectal:  Deferred.***  Pulses:  Normal pulses noted. Extremities:  No clubbing or edema.  No cyanosis. Neurologic:  Alert and oriented x3;  grossly normal neurologically. Skin:  Intact without significant lesions or rashes.  ***No jaundice. Lymph Nodes:  No significant cervical adenopathy. Psych:  Alert and cooperative. Normal mood and affect.  Imaging Studies: No results found.  Assessment and Plan:   Angie Tucker is a 41 y.o. y/o female ***    Lucilla Lame, MD. Marval Regal    Note: This dictation was prepared with Dragon dictation along with smaller phrase technology. Any transcriptional errors that result from this process are unintentional.

## 2021-10-06 LAB — CALPROTECTIN, FECAL: Calprotectin, Fecal: 194 ug/g — ABNORMAL HIGH (ref 0–120)

## 2021-10-07 ENCOUNTER — Encounter: Payer: Self-pay | Admitting: Family

## 2021-10-07 ENCOUNTER — Other Ambulatory Visit: Payer: Self-pay

## 2021-10-18 ENCOUNTER — Encounter: Payer: Self-pay | Admitting: Gastroenterology

## 2021-10-20 ENCOUNTER — Other Ambulatory Visit: Payer: Self-pay

## 2021-10-20 DIAGNOSIS — R197 Diarrhea, unspecified: Secondary | ICD-10-CM

## 2021-11-09 ENCOUNTER — Other Ambulatory Visit: Payer: Self-pay

## 2021-11-16 ENCOUNTER — Other Ambulatory Visit: Payer: Self-pay

## 2021-11-24 ENCOUNTER — Encounter (INDEPENDENT_AMBULATORY_CARE_PROVIDER_SITE_OTHER): Payer: Self-pay

## 2021-12-07 ENCOUNTER — Other Ambulatory Visit: Payer: Self-pay

## 2021-12-07 ENCOUNTER — Ambulatory Visit: Payer: 59 | Admitting: Allergy and Immunology

## 2021-12-07 ENCOUNTER — Encounter: Payer: Self-pay | Admitting: Allergy and Immunology

## 2021-12-07 VITALS — BP 122/80 | HR 65 | Temp 97.7°F | Resp 18 | Ht 62.0 in | Wt 204.2 lb

## 2021-12-07 DIAGNOSIS — T783XXA Angioneurotic edema, initial encounter: Secondary | ICD-10-CM

## 2021-12-07 DIAGNOSIS — T7840XA Allergy, unspecified, initial encounter: Secondary | ICD-10-CM

## 2021-12-07 MED ORDER — LORATADINE 10 MG PO TABS
10.0000 mg | ORAL_TABLET | Freq: Two times a day (BID) | ORAL | 5 refills | Status: AC
  Filled 2021-12-07 – 2022-03-22 (×2): qty 60, 30d supply, fill #0

## 2021-12-07 MED ORDER — FAMOTIDINE 40 MG PO TABS
40.0000 mg | ORAL_TABLET | Freq: Every day | ORAL | 5 refills | Status: DC
  Filled 2021-12-07 – 2022-04-04 (×3): qty 30, 30d supply, fill #0

## 2021-12-07 NOTE — Patient Instructions (Addendum)
  1.  Allergen avoidance measures?  2. Blood -C1 esterase inhibitor level and function, C4, C1q, thyroid peroxidase antibody, alpha-gal panel  3. Preventative treatment for angioedema? Daily Loratadine 10 mg - 20 mg  4. Acute treatment for angioedema:   A.  Diphenhydramine/Benadryl  B.  Famotidine/Pepcid  5.  Attempt to remain away from systemic steroid use  6.  Further evaluation?  Yes if recurrent episodes

## 2021-12-07 NOTE — Progress Notes (Signed)
[facility]   Dear [name],  Thank you for referring Angie Tucker to the [facility] on 12/07/2021.   Below is a summation of this patient's evaluation and recommendations.  Thank you for your referral. I will keep you informed about this patient's response to treatment.   If you have any questions please do not hesitate to contact me.   Sincerely,  Jiles Prows, MD Allergy / Immunology [place]   ______________________________________________________________________    NEW PATIENT NOTE  Referring Provider: Burnard Hawthorne, FNP Primary Provider: Burnard Hawthorne, FNP Date of office visit: 12/07/2021    Subjective:   Chief Complaint:  Angie Tucker (DOB: 03-Dec-1980) is a 41 y.o. female who presents to the clinic on 12/07/2021 with a chief complaint of Angioedema (Had lip swelling issues since she was 15. Its went away when she was in high school and came back when she turned 30. Now it appears randomly. ) and Eczema (Back, stomach, arms - flared the worse when she was pregnant in 2022) .     HPI: Taisa presents to this clinic in evaluation of lip swelling.  She has an early history of lip swelling starting around the age of 60 that seem to resolve sometime in her 66s and she had no problems with lip swelling for at least 5 years.  She thought initially that her lip swelling was associated with jalapeno consumption but there was not really a firm temporal relationship between consumption of jalapeno and the development of the swelling.  She delivered her baby in November 2022 and she has had a recrudescence of the swelling problem and she has had at least 5 episodes of lip swelling with her last episode occurring 20 October 2021.  When she develops lip swelling she will take 20 mg of prednisone and use a Benadryl and within  about 12 hours most of her swelling has resolved.  She does not have any associated systemic or constitutional symptoms associated with the swelling.  There is not really an obvious provoking factor giving rise to the swelling.  She has not started any new supplements or health foods or herbs or new medications or have symptoms suggesting an ongoing infectious disease or have a significant change in her environment.  She does have a history of eczema that flared up quite significantly during her first trimester pregnancy last year but by the third trimester had completely resolved and does not require any therapy at this point in time.  She is undergoing GI evaluation for diarrhea ever since her delivery in 2022.  Stool testing for pathogens has been negative.  She has not had a cholecystectomy.  She does not really have an atopic history in general.  There is not a history of allergic rhinitis or asthma or food sensitivity or hymenoptera venom hypersensitivity.  Past Medical History:  Diagnosis Date   Anxiety    BRCA negative 03/2018   MyRisk neg   Broken wrist Right Wrist   Depression    Depression    Eczema    Family history of BRCA gene positive    PGM with ovarian cancer   Family history of breast cancer    Family history of ovarian cancer    Family history of pancreatic cancer    Herpes    Pregnancy induced hypertension     Past Surgical  History:  Procedure Laterality Date   CESAREAN SECTION N/A 02/21/2021   Procedure: Primary CESAREAN SECTION;  Surgeon: Brien Few, MD;  Location: [facility];  Service: Obstetrics;  Laterality: N/A;  EDD: 03/03/21   FRACTURE SURGERY     WRIST SURGERY  2011   WRIST SURGERY Left     Allergies as of 12/07/2021       Reactions   Food Swelling   Jalapenos-oral/mouth swelling   Amoxicillin Rash        Medication List    EPINEPHrine 0.3 mg/0.3 mL Soaj injection Commonly known as: EPI-PEN Inject 0.3 mg into the muscle as needed for  anaphylaxis.   etonogestrel-ethinyl estradiol 0.12-0.015 MG/24HR vaginal ring Commonly known as: NuvaRing Insert 1 ring vaginally once monthly (Insert 1 vaginal ring every month by vaginal route.)   ibuprofen 600 MG tablet Commonly known as: ADVIL Take 1 tablet (600 mg total) by mouth every 6 (six) hours.   predniSONE 20 MG tablet Commonly known as: DELTASONE Take 1 tablet by mouth once daily for 10 days   PRENATAL+DHA PO Take 1 tablet by mouth in the morning.   sertraline 100 MG tablet Commonly known as: ZOLOFT TAKE 1 TABLET (100 MG TOTAL) BY MOUTH AT BEDTIME.   triamcinolone ointment 0.1 % Commonly known as: KENALOG Apply topically to affected areas twice daily as needed for itch   Avoid applying to face, groin, and axilla. Use as directed.   valACYclovir 1000 MG tablet Commonly known as: VALTREX TAKE 1 TABLET BY MOUTH DAILY.    Review of systems negative except as noted in HPI / PMHx or noted below:  Review of Systems  Constitutional: Negative.   HENT: Negative.    Eyes: Negative.   Respiratory: Negative.    Cardiovascular: Negative.   Gastrointestinal: Negative.   Genitourinary: Negative.   Musculoskeletal: Negative.   Skin: Negative.   Neurological: Negative.   Endo/Heme/Allergies: Negative.   Psychiatric/Behavioral: Negative.      Family History  Problem Relation Age of Onset   Hyperlipidemia Mother    Hypertension Mother    Cancer Mother        pancreatic and breast   Breast cancer Mother 108   Pancreatic cancer Mother 55   Depression Mother    Anxiety disorder Mother    Alcohol abuse Mother    Ovarian cancer Paternal Grandmother 17   Uterine cancer Paternal Grandmother 6       BRCA positive   Breast cancer Paternal Grandmother 9   Kidney cancer Paternal Grandfather 60   Multiple myeloma Maternal Grandmother 75   Pancreatic cancer Paternal Great-grandmother    Pancreatic cancer Other     Social History   Socioeconomic History   Marital  status: Married    Spouse name: Not on file   Number of children: Not on file   Years of education: Not on file   Highest education level: Not on file  Occupational History   Not on file  Tobacco Use   Smoking status: Never   Smokeless tobacco: Never  Vaping Use   Vaping Use: Never used  Substance and Sexual Activity   Alcohol use: Not Currently    Comment: occasionally   Drug use: Never   Sexual activity: Yes    Birth control/protection: None  Other Topics Concern   Not on file  Social History Narrative      NP in [employer]      Has twin fraternal sister, and brother.  Erby Pian- born 2022   Environmental and Social history  Lives in a house with a dry environment, a dog located inside the household, carpet in the bedroom, no plastic on the bed, no plastic on the pillow, no smoking ongoing with inside the household.  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  Objective:   Vitals:   12/07/21 0921  BP: 122/80  Pulse: 65  Resp: 18  Temp: 97.7 F (36.5 C)  SpO2: 99%   Height: '5\' 2"'  (157.5 cm) Weight: 204 lb 3.2 oz (92.6 kg)  Physical Exam Constitutional:      Appearance: She is not diaphoretic.  HENT:     Head: Normocephalic.     Right Ear: Tympanic membrane, ear canal and external ear normal.     Left Ear: Tympanic membrane, ear canal and external ear normal.     Nose: Nose normal. No mucosal edema or rhinorrhea.     Mouth/Throat:     Pharynx: Uvula midline. No oropharyngeal exudate.  Eyes:     Conjunctiva/sclera: Conjunctivae normal.  Neck:     Thyroid: No thyromegaly.     Trachea: Trachea normal. No tracheal tenderness or tracheal deviation.  Cardiovascular:     Rate and Rhythm: Normal rate and regular rhythm.     Heart sounds: Normal heart sounds, S1 normal and S2 normal. No murmur heard. Pulmonary:     Effort: No respiratory distress.     Breath sounds: Normal breath sounds. No stridor. No wheezing or rales.   Lymphadenopathy:     Head:     Right side of head: No tonsillar adenopathy.     Left side of head: No tonsillar adenopathy.     Cervical: No cervical adenopathy.  Skin:    Findings: No erythema or rash.     Nails: There is no clubbing.  Neurological:     Mental Status: She is alert.     Diagnostics: Allergy skin tests were performed.  She did not demonstrate any hypersensitivity against a screening panel of foods.  Results of blood tests obtained 28 September 2021 identifies WBC 6.7, absolute eosinophil 100, absolute lymphocyte 2000, hemoglobin 12.9, platelet 355, TSH 0.659 IU/mL  Results of blood tests obtained 14 July 2021 identifies creatinine 0.87 MG/DL, AST 17 U/L, ALT 19 U/L,  Assessment and Plan:    1. Angioedema, initial encounter   2. Allergic reaction, initial encounter    1.  Allergen avoidance measures?  2. Blood -C1 esterase inhibitor level and function, C4, C1q, thyroid peroxidase antibody, alpha-gal panel  3. Preventative treatment for angioedema? Daily Loratadine 10 mg - 20 mg  4. Acute treatment for angioedema:   A.  Diphenhydramine/Benadryl  B.  Famotidine/Pepcid  5.  Attempt to remain away from systemic steroid use  6.  Further evaluation?  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  Interestingly, this was an issue that developed very early in life around the age of 20 and was active for a few years before becoming inactive until her recent pregnancy and delivery.  We need to rule out possible hereditary angioedema with the blood test noted above and look at thyroiditis contributing to immune activation and also rule out alpha gal syndrome.  I have asked her to remain away from the use of systemic steroids that as it may not be required for her episodes that may occur in the future and she can use a combination  of an H1 and H2 receptor blocker and she has the option of using daily  loratadine in a preventative manner.  I will contact her with the results of her blood test once they are available for review.  Jiles Prows, MD Allergy / Immunology [facility]

## 2021-12-08 ENCOUNTER — Encounter: Payer: Self-pay | Admitting: Allergy and Immunology

## 2021-12-14 LAB — ALPHA-GAL PANEL
Allergen Lamb IgE: <0.1 kU/L
Beef IgE: <0.1 kU/L
IgE (Immunoglobulin E), Serum: 148 IU/mL (ref 6–495)
O215-IgE Alpha-Gal: <0.1 kU/L
Pork IgE: <0.1 kU/L

## 2021-12-14 LAB — C1 ESTERASE INHIBITOR: C1INH SerPl-mCnc: 31 mg/dL (ref 21–39)

## 2021-12-14 LAB — C4 COMPLEMENT: Complement C4, Serum: 36 mg/dL (ref 12–38)

## 2021-12-14 LAB — C1 ESTERASE INHIBITOR, FUNCTIONAL: C1INH Functional/C1INH Total MFr SerPl: 83 %mean normal

## 2021-12-14 LAB — COMPLEMENT COMPONENT C1Q: Complement C1Q: 13.4 mg/dL (ref 10.3–20.5)

## 2021-12-14 LAB — THYROID PEROXIDASE ANTIBODY: Thyroperoxidase Ab SerPl-aCnc: <9 IU/mL (ref 0–34)

## 2021-12-24 ENCOUNTER — Other Ambulatory Visit: Payer: Self-pay

## 2021-12-27 ENCOUNTER — Other Ambulatory Visit: Payer: Self-pay

## 2021-12-27 MED ORDER — PREDNISONE 20 MG PO TABS
20.0000 mg | ORAL_TABLET | Freq: Every day | ORAL | 0 refills | Status: DC
  Filled 2021-12-27: qty 10, 10d supply, fill #0

## 2022-01-02 ENCOUNTER — Other Ambulatory Visit: Payer: Self-pay | Admitting: Family

## 2022-01-02 ENCOUNTER — Other Ambulatory Visit: Payer: Self-pay

## 2022-01-02 DIAGNOSIS — B009 Herpesviral infection, unspecified: Secondary | ICD-10-CM

## 2022-01-02 MED FILL — Sertraline HCl Tab 100 MG: ORAL | 90 days supply | Qty: 90 | Fill #1 | Status: AC

## 2022-01-03 ENCOUNTER — Other Ambulatory Visit: Payer: Self-pay

## 2022-01-04 ENCOUNTER — Other Ambulatory Visit: Payer: Self-pay

## 2022-01-04 ENCOUNTER — Encounter: Payer: Self-pay | Admitting: Gastroenterology

## 2022-01-04 ENCOUNTER — Ambulatory Visit: Payer: 59 | Admitting: Gastroenterology

## 2022-01-04 DIAGNOSIS — R197 Diarrhea, unspecified: Secondary | ICD-10-CM

## 2022-01-04 MED FILL — Valacyclovir HCl Tab 1 GM: ORAL | 90 days supply | Qty: 90 | Fill #0 | Status: AC

## 2022-01-04 NOTE — Progress Notes (Signed)
Gastroenterology Consultation  Referring Provider:     Burnard Hawthorne, FNP Primary Care Physician:  Burnard Hawthorne, FNP Primary Gastroenterologist:  Dr. Allen Norris     Reason for Consultation:     Diarrhea        HPI:   Angie Tucker is a 41 y.o. y/o female referred for consultation & management of diarrhea by Dr. Vidal Schwalbe, Yvetta Coder, FNP.  This patient comes in today with a report of diarrhea that started in March.  The patient states that she has no gas or bloating or abdominal pain associated with the diarrhea.  She reports that she has never had this in the past.  The diarrhea does not wake her up from sleep.  The patient had a baby about 10 months ago.  She is trying to lose weight but has no unexplained weight loss.  She states that she has been under a lot more stress recently.  She also states that she partakes in dairy products 3-4 times a week.  There is no report of any black or bloody stools.  There is also no family history of any colon cancer or colon polyps.  Past Medical History:  Diagnosis Date   Anxiety    BRCA negative 03/2018   MyRisk neg   Broken wrist Right Wrist   Depression    Depression    Eczema    Family history of BRCA gene positive    PGM with ovarian cancer   Family history of breast cancer    Family history of ovarian cancer    Family history of pancreatic cancer    Herpes    Pregnancy induced hypertension     Past Surgical History:  Procedure Laterality Date   CESAREAN SECTION N/A 02/21/2021   Procedure: Primary CESAREAN SECTION;  Surgeon: Brien Few, MD;  Location: [facility];  Service: Obstetrics;  Laterality: N/A;  EDD: 03/03/21   FRACTURE SURGERY     WRIST SURGERY  2011   WRIST SURGERY Left     Prior to Admission medications   Medication Sig Start Date End Date Taking? Authorizing Provider  EPINEPHrine 0.3 mg/0.3 mL IJ SOAJ injection Inject 0.3 mg into the muscle as needed for anaphylaxis. 07/14/21  Yes Burnard Hawthorne, FNP   etonogestrel-ethinyl estradiol (NUVARING) 0.12-0.015 MG/24HR vaginal ring Insert 1 vaginal ring every month by vaginal route. 04/07/21  Yes   famotidine (PEPCID) 40 MG tablet Take 1 tablet (40 mg total) by mouth daily. 12/07/21  Yes Kozlow, Donnamarie Poag, MD  ibuprofen (ADVIL) 600 MG tablet Take 1 tablet (600 mg total) by mouth every 6 (six) hours. 02/23/21  Yes Azucena Fallen, MD  loratadine (CLARITIN) 10 MG tablet Take 1 tablet (10 mg total) by mouth 2 (two) times daily. 12/07/21  Yes Kozlow, Donnamarie Poag, MD  predniSONE (DELTASONE) 20 MG tablet Take 1 tablet (20 mg total) by mouth daily FOR 10 DAYS 12/27/21  Yes Lang Snow, NP  Prenatal MV-Min-Fe Fum-FA-DHA (PRENATAL+DHA PO) Take 1 tablet by mouth in the morning.   Yes [provider]  sertraline (ZOLOFT) 100 MG tablet TAKE 1 TABLET (100 MG TOTAL) BY MOUTH AT BEDTIME. 07/09/21  Yes Arnett, Yvetta Coder, FNP  triamcinolone ointment (KENALOG) 0.1 % Apply topically to affected areas twice daily as needed for itch   Avoid applying to face, groin, and axilla. Use as directed. 07/27/20  Yes Moye, [place], MD  valACYclovir (VALTREX) 1000 MG tablet TAKE 1 TABLET BY MOUTH DAILY. 01/04/22 01/04/23  Yes Burnard Hawthorne, FNP  misoprostol (CYTOTEC) 200 MCG tablet Place four tablets in between your gums and cheeks (two tablets on each side) as instructed OR insert four tablets vaginally, repeat every 6-hrs as needed Patient not taking: Reported on 04/13/2020 03/26/20 07/17/20  Orlie Pollen, CNM    Family History  Problem Relation Age of Onset   Hyperlipidemia Mother    Hypertension Mother    Cancer Mother        pancreatic and breast   Breast cancer Mother 63   Pancreatic cancer Mother 58   Depression Mother    Anxiety disorder Mother    Alcohol abuse Mother    Ovarian cancer Paternal Grandmother 83   Uterine cancer Paternal Grandmother 71       BRCA positive   Breast cancer Paternal Grandmother 48   Kidney cancer Paternal Grandfather 96    Multiple myeloma Maternal Grandmother 75   Pancreatic cancer Paternal Great-grandmother    Pancreatic cancer Other      Social History   Tobacco Use   Smoking status: Never   Smokeless tobacco: Never  Vaping Use   Vaping Use: Never used  Substance Use Topics   Alcohol use: Not Currently    Comment: occasionally   Drug use: Never    Allergies as of 01/04/2022 - Review Complete 01/04/2022  Allergen Reaction Noted   Food Swelling 02/18/2021   Amoxicillin Rash 06/04/2017    Review of Systems:    All systems reviewed and negative except where noted in HPI.   Physical Exam:  There were no vitals taken for this visit. No LMP recorded. General:   Alert,  Well-developed, well-nourished, pleasant and cooperative in NAD Head:  Normocephalic and atraumatic. Eyes:  Sclera clear, no icterus.   Conjunctiva pink. Ears:  Normal auditory acuity. Neck:  Supple; no masses or thyromegaly. Lungs:  Respirations even and unlabored.  Clear throughout to auscultation.   No wheezes, crackles, or rhonchi. No acute distress. Heart:  Regular rate and rhythm; no murmurs, clicks, rubs, or gallops. Abdomen:  Normal bowel sounds.  No bruits.  Soft, non-tender and non-distended without masses, hepatosplenomegaly or hernias noted.  No guarding or rebound tenderness.  Negative Carnett sign.   Rectal:  Deferred.  Pulses:  Normal pulses noted. Extremities:  No clubbing or edema.  No cyanosis. Neurologic:  Alert and oriented x3;  grossly normal neurologically. Skin:  Intact without significant lesions or rashes.  No jaundice. Lymph Nodes:  No significant cervical adenopathy. Psych:  Alert and cooperative. Normal mood and affect.  Imaging Studies: No results found.  Assessment and Plan:   Angie Tucker is a 41 y.o. y/o female who comes in today with a history of diarrhea.  The patient has been told as a first step to stop dairy products for 1 week and see if her symptoms improve.  She has been told  that if the symptoms do not respond to dairy avoidance that she should send me a message and we will set her up for a colonoscopy to look for any other cause of her diarrhea.  She has also been told that irritable bowel syndrome is a common cause of her symptoms and may be related to her increased stress.  The patient has been explained the plan and agrees with it.    Lucilla Lame, MD. Marval Regal    Note: This dictation was prepared with Dragon dictation along with smaller phrase technology. Any transcriptional errors that result from this process are unintentional.

## 2022-01-11 ENCOUNTER — Telehealth (INDEPENDENT_AMBULATORY_CARE_PROVIDER_SITE_OTHER): Payer: 59 | Admitting: Family

## 2022-01-11 ENCOUNTER — Other Ambulatory Visit: Payer: Self-pay

## 2022-01-11 ENCOUNTER — Encounter: Payer: Self-pay | Admitting: Family

## 2022-01-11 VITALS — Ht 62.0 in | Wt 208.2 lb

## 2022-01-11 DIAGNOSIS — B001 Herpesviral vesicular dermatitis: Secondary | ICD-10-CM

## 2022-01-11 DIAGNOSIS — R7303 Prediabetes: Secondary | ICD-10-CM

## 2022-01-11 MED ORDER — SEMAGLUTIDE-WEIGHT MANAGEMENT 0.25 MG/0.5ML ~~LOC~~ SOAJ
0.2500 mg | SUBCUTANEOUS | 1 refills | Status: DC
  Filled 2022-01-11 (×2): qty 2, 28d supply, fill #0

## 2022-01-11 NOTE — Progress Notes (Signed)
Virtual Visit via Video Note  I connected with@  on 01/11/22 at  1:30 PM EDT by a video enabled telemedicine application and verified that I am speaking with the correct person using two identifiers.  Location patient: work [employer]  Persons participating in the virtual visit: patient, provider  I discussed the limitations of evaluation and management by telemedicine and the availability of in person appointments. The patient expressed understanding and agreed to proceed.   HPI: She would like to discuss weight loss medications.  She is frustrated after having a baby last November and not losing weight.     no family or personal history of medullary thyroid cancer, MEN . No history of pancreatitis  Consult with Dr. Allen Norris 01/04/2022 for diarrhea.  Currently avoiding dairy products.  Dr. Allen Norris to consider colonoscopy if symptoms persist Consult with allergy, Dr. Neldon Mc in regards to angioedema ROS: See pertinent positives and negatives per HPI.    EXAM:  VITALS per patient if applicable: Ht 5\' 2"  (1.575 m)   Wt 208 lb 3.2 oz (94.4 kg)   BMI 38.08 kg/m  BP Readings from Last 3 Encounters:  12/07/21 122/80  07/14/21 118/68  02/23/21 128/80   Wt Readings from Last 3 Encounters:  01/11/22 208 lb 3.2 oz (94.4 kg)  12/07/21 204 lb 3.2 oz (92.6 kg)  09/27/21 208 lb 1.6 oz (94.4 kg)    GENERAL: alert, oriented, appears well and in no acute distress  HEENT: atraumatic, conjunttiva clear, no obvious abnormalities on inspection of external nose and ears  NECK: normal movements of the head and neck  LUNGS: on inspection no signs of respiratory distress, breathing rate appears normal, no obvious gross SOB, gasping or wheezing  CV: no obvious cyanosis  MS: moves all visible extremities without noticeable abnormality  PSYCH/NEURO: pleasant and cooperative, no obvious depression or anxiety, speech and thought processing grossly intact  ASSESSMENT AND PLAN:  Discussed  the following assessment and plan:  Problem List Items Addressed This Visit       Digestive   Herpes labialis    Chronic, stable. Oral herpes, stays on immunosuppression. She prefers valtrex 1000mg  QD versus 500mg  QD.         Other   Prediabetes - Primary    Lab Results  Component Value Date   HGBA1C 5.8 07/14/2021  Discussed options for weight loss including metformin, GLP-1 agonist.  Patient has no family or personal history of medullary thyroid cancer.  No history of pancreatitis.  Discussed black box label as it relates GLP-1 agonist.  She is most comfortable with trial of Wegovy.  If unable to find due to backorder we discussed Saxenda. If unable to find either, patient is willing to try metformin.  She will let me know how she is doing      Relevant Medications   Semaglutide-Weight Management 0.25 MG/0.5ML SOAJ    -we discussed possible serious and likely etiologies, options for evaluation and workup, limitations of telemedicine visit vs in person visit, treatment, treatment risks and precautions. Pt prefers to treat via telemedicine empirically rather then risking or undertaking an in person visit at this moment.  .   I discussed the assessment and treatment plan with the patient. The patient was provided an opportunity to ask questions and all were answered. The patient agreed with the plan and demonstrated an understanding of the instructions.   The patient was advised to call back or seek an in-person evaluation if the symptoms worsen or if the  condition fails to improve as anticipated.   [name], FNP

## 2022-01-11 NOTE — Patient Instructions (Signed)
We have discussed starting non insulin daily injectable medication called Wegovy  which is a glucagon like peptide (GLP 1) agonist and works by delaying gastric emptying and increasing insulin secretion.It is given once per week. Most patients see significant weight loss with this drug class.   You may NOT take either medication if you or your family has history of thyroid, parathyroid, OR adrenal cancer. Please confirm you and your family does NOT have this history as this drug class has black box warning on this medication for that reason.   Advise to follow with directions on prescription and slowly increase from 0.25mg  [place] once per week ;stay here for 4 weeks. You may then increase to 0.5mg  [place] once per week and stay there for 4 weeks.  We can slowly titrate further at follow up with goal of no more than 1-2 lbs weight loss per week.  Good luck!

## 2022-01-11 NOTE — Assessment & Plan Note (Signed)
Chronic, stable. Oral herpes, stays on immunosuppression. She prefers valtrex 1000mg  QD versus 500mg  QD.

## 2022-01-11 NOTE — Assessment & Plan Note (Signed)
Lab Results  Component Value Date   HGBA1C 5.8 07/14/2021   Discussed options for weight loss including metformin, GLP-1 agonist.  Patient has no family or personal history of medullary thyroid cancer.  No history of pancreatitis.  Discussed black box label as it relates GLP-1 agonist.  She is most comfortable with trial of Wegovy.  If unable to find due to backorder we discussed Saxenda. If unable to find either, patient is willing to try metformin.  She will let me know how she is doing

## 2022-01-12 ENCOUNTER — Encounter: Payer: Self-pay | Admitting: Family

## 2022-01-17 ENCOUNTER — Other Ambulatory Visit: Payer: Self-pay | Admitting: Family

## 2022-01-17 ENCOUNTER — Other Ambulatory Visit: Payer: Self-pay

## 2022-01-17 MED ORDER — METFORMIN HCL ER 500 MG PO TB24
1000.0000 mg | ORAL_TABLET | Freq: Two times a day (BID) | ORAL | 3 refills | Status: DC
  Filled 2022-01-17: qty 360, 90d supply, fill #0
  Filled 2022-04-04: qty 360, 90d supply, fill #1

## 2022-01-18 ENCOUNTER — Telehealth: Payer: Self-pay | Admitting: Family

## 2022-01-18 NOTE — Telephone Encounter (Signed)
PA started  Angie Tucker (Key: [id number]) Rx #: [id number] 0.25MG /0.5ML auto-injectors   Form [employer] [place] [phone] phone [phone] fax

## 2022-01-24 ENCOUNTER — Encounter: Payer: Self-pay | Admitting: Family

## 2022-01-24 DIAGNOSIS — R7303 Prediabetes: Secondary | ICD-10-CM

## 2022-01-27 NOTE — Telephone Encounter (Signed)
Pt called stating she sent a mychart message to the provider 

## 2022-01-28 ENCOUNTER — Other Ambulatory Visit: Payer: Self-pay | Admitting: Family

## 2022-01-28 MED ORDER — WEGOVY 0.25 MG/0.5ML ~~LOC~~ SOAJ: 0.2500 mg | SUBCUTANEOUS | 1 refills | Status: DC

## 2022-02-02 ENCOUNTER — Ambulatory Visit: Payer: 59 | Admitting: Gastroenterology

## 2022-02-03 ENCOUNTER — Other Ambulatory Visit: Payer: Self-pay

## 2022-02-08 ENCOUNTER — Other Ambulatory Visit: Payer: Self-pay

## 2022-02-22 ENCOUNTER — Encounter: Payer: Self-pay | Admitting: Family

## 2022-02-23 ENCOUNTER — Ambulatory Visit
Admission: RE | Admit: 2022-02-23 | Discharge: 2022-02-23 | Disposition: A | Discharge status: Home / Self Care | Payer: 59 | Source: Ambulatory Visit | Attending: Family | Admitting: Family

## 2022-02-23 DIAGNOSIS — Z1371 Encounter for nonprocreative screening for genetic disease carrier status: Secondary | ICD-10-CM | POA: Insufficient documentation

## 2022-02-23 DIAGNOSIS — N6489 Other specified disorders of breast: Secondary | ICD-10-CM | POA: Diagnosis not present

## 2022-02-23 DIAGNOSIS — Z9189 Other specified personal risk factors, not elsewhere classified: Secondary | ICD-10-CM | POA: Diagnosis not present

## 2022-02-23 DIAGNOSIS — Z1231 Encounter for screening mammogram for malignant neoplasm of breast: Secondary | ICD-10-CM | POA: Insufficient documentation

## 2022-02-23 MED ORDER — GADOBUTROL 1 MMOL/ML IV SOLN
7.5000 mL | Freq: Once | INTRAVENOUS | Status: AC | PRN
  Administered 2022-02-23: 7.5 mL via INTRAVENOUS

## 2022-02-28 ENCOUNTER — Ambulatory Visit: Payer: 59 | Admitting: Family

## 2022-03-01 NOTE — Telephone Encounter (Signed)
Spoke to patient in regards to PA for [facility] and explained to her that it was denied and she understood and stated that she will speak to [facility] about it tomorrow at her appt

## 2022-03-02 ENCOUNTER — Ambulatory Visit: Payer: 59 | Admitting: Family

## 2022-03-02 ENCOUNTER — Other Ambulatory Visit: Payer: Self-pay

## 2022-03-02 ENCOUNTER — Encounter: Payer: Self-pay | Admitting: Family

## 2022-03-02 VITALS — BP 126/78 | HR 84 | Temp 97.7°F | Ht 62.0 in | Wt 217.4 lb

## 2022-03-02 DIAGNOSIS — R7303 Prediabetes: Secondary | ICD-10-CM

## 2022-03-02 DIAGNOSIS — F419 Anxiety disorder, unspecified: Secondary | ICD-10-CM

## 2022-03-02 DIAGNOSIS — R5383 Other fatigue: Secondary | ICD-10-CM

## 2022-03-02 DIAGNOSIS — F32A Depression, unspecified: Secondary | ICD-10-CM | POA: Diagnosis not present

## 2022-03-02 MED ORDER — BUPROPION HCL ER (XL) 150 MG PO TB24
150.0000 mg | ORAL_TABLET | Freq: Every day | ORAL | 2 refills | Status: DC
  Filled 2022-03-02: qty 60, 33d supply, fill #0
  Filled 2022-04-04: qty 60, 33d supply, fill #1

## 2022-03-02 NOTE — Progress Notes (Signed)
Subjective:    Patient ID: Angie Tucker, female    DOB: [birth year], 41 y.o.   MRN: [id number]  CC: Angie Tucker is a 40 y.o. female who presents today for follow up.   HPI: Complains of daily fatigue over the last couple months.   Sleep is restorative  More tired in the afternoon.  Exercise improves energy.  She snores  Waking up with HA, twice per week. NO vision changes, fever, weight loss  HA is  severe. Lamonte Sakai is not positional. No HA with valsalva She feels zoloft 100 mg is very effective for her.  She does not feel fatigue is related to depression or anxiety  Her 38-year-old daughter whom may wake up once during the night but she generally falls back to sleep easily.   She is not on wegovy, insurance had denied.   MRI breast performed 02/23/22  Colonoscopy deferred after seeing Dr. Allen Norris 01/04/22  No history of anorexia, bulimia, seizure.  She does not drink alcohol HISTORY:  Past Medical History:  Diagnosis Date   Anxiety    BRCA negative 03/2018   MyRisk neg   Broken wrist Right Wrist   Depression    Depression    Eczema    Family history of BRCA gene positive    PGM with ovarian cancer   Family history of breast cancer    Family history of ovarian cancer    Family history of pancreatic cancer    Herpes    Pregnancy induced hypertension    Past Surgical History:  Procedure Laterality Date   CESAREAN SECTION N/A 02/21/2021   Procedure: Primary CESAREAN SECTION;  Surgeon: Brien Few, MD;  Location: [facility];  Service: Obstetrics;  Laterality: N/A;  EDD: 03/03/21   FRACTURE SURGERY     WRIST SURGERY  2011   WRIST SURGERY Left    Family History  Problem Relation Age of Onset   Hyperlipidemia Mother    Hypertension Mother    Cancer Mother        pancreatic and breast   Breast cancer Mother 53   Pancreatic cancer Mother 33   Depression Mother    Anxiety disorder Mother    Alcohol abuse Mother    Multiple myeloma Maternal Grandmother 78    Ovarian cancer Paternal Grandmother 28   Uterine cancer Paternal Grandmother 4       BRCA positive   Breast cancer Paternal Grandmother 60   Kidney cancer Paternal Grandfather 62   Pancreatic cancer Paternal Great-grandmother    Pancreatic cancer Other    Thyroid cancer Neg Hx    Multiple endocrine neoplasia Neg Hx    CAD Neg Hx     Allergies: Food and Amoxicillin Current Outpatient Medications on File Prior to Visit  Medication Sig Dispense Refill   EPINEPHrine 0.3 mg/0.3 mL IJ SOAJ injection Inject 0.3 mg into the muscle as needed for anaphylaxis. 2 each 2   etonogestrel-ethinyl estradiol (NUVARING) 0.12-0.015 MG/24HR vaginal ring Insert 1 vaginal ring every month by vaginal route. 3 each 3   famotidine (PEPCID) 40 MG tablet Take 1 tablet (40 mg total) by mouth daily. 30 tablet 5   ibuprofen (ADVIL) 600 MG tablet Take 1 tablet (600 mg total) by mouth every 6 (six) hours. 30 tablet 0   loratadine (CLARITIN) 10 MG tablet Take 1 tablet (10 mg total) by mouth 2 (two) times daily. 60 tablet 5   metFORMIN (GLUCOPHAGE-XR) 500 MG 24 hr tablet Take 2 tablets (1,000 mg total)  by mouth 2 (two) times daily. 360 tablet 3   Prenatal MV-Min-Fe Fum-FA-DHA (PRENATAL+DHA PO) Take 1 tablet by mouth in the morning.     sertraline (ZOLOFT) 100 MG tablet TAKE 1 TABLET (100 MG TOTAL) BY MOUTH AT BEDTIME. 90 tablet 3   triamcinolone ointment (KENALOG) 0.1 % Apply topically to affected areas twice daily as needed for itch   Avoid applying to face, groin, and axilla. Use as directed. 80 g 1   valACYclovir (VALTREX) 1000 MG tablet TAKE 1 TABLET BY MOUTH DAILY. 90 tablet 0   [DISCONTINUED] misoprostol (CYTOTEC) 200 MCG tablet Place four tablets in between your gums and cheeks (two tablets on each side) as instructed OR insert four tablets vaginally, repeat every 6-hrs as needed (Patient not taking: Reported on 04/13/2020) 12 tablet 0   No current facility-administered medications on file prior to visit.     Social History   Tobacco Use   Smoking status: Never   Smokeless tobacco: Never  Vaping Use   Vaping Use: Never used  Substance Use Topics   Alcohol use: Not Currently    Comment: occasionally   Drug use: Never    Review of Systems  Constitutional:  Positive for fatigue. Negative for chills and fever.  Respiratory:  Negative for cough.   Cardiovascular:  Negative for chest pain and palpitations.  Gastrointestinal:  Negative for nausea and vomiting.      Objective:    BP 126/78 (BP Location: Left Arm, Patient Position: Sitting, Cuff Size: Normal)   Pulse 84   Temp 97.7 F (36.5 C) (Oral)   Ht _0  (1.575 m)   Wt 217 lb 6.4 oz (98.6 kg)   LMP 02/11/2022   SpO2 98%   BMI 39.76 kg/m  BP Readings from Last 3 Encounters:  03/02/22 126/78  12/07/21 122/80  07/14/21 118/68   Wt Readings from Last 3 Encounters:  03/02/22 217 lb 6.4 oz (98.6 kg)  01/11/22 208 lb 3.2 oz (94.4 kg)  12/07/21 204 lb 3.2 oz (92.6 kg)    Physical Exam Vitals reviewed.  Constitutional:      Appearance: She is well-developed.  Eyes:     Conjunctiva/sclera: Conjunctivae normal.  Neck:     Thyroid: No thyroid mass or thyromegaly.  Cardiovascular:     Rate and Rhythm: Normal rate and regular rhythm.     Pulses: Normal pulses.     Heart sounds: Normal heart sounds.  Pulmonary:     Effort: Pulmonary effort is normal.     Breath sounds: Normal breath sounds. No wheezing, rhonchi or rales.  Lymphadenopathy:     Head:     Right side of head: No submental, submandibular, tonsillar, preauricular, posterior auricular or occipital adenopathy.     Left side of head: No submental, submandibular, tonsillar, preauricular, posterior auricular or occipital adenopathy.     Cervical: No cervical adenopathy.  Skin:    General: Skin is warm and dry.  Neurological:     Mental Status: She is alert.  Psychiatric:        Speech: Speech normal.        Behavior: Behavior normal.        Thought Content:  Thought content normal.        Assessment & Plan:   Problem List Items Addressed This Visit       Other   Anxiety and depression    Chronic, stable.  Continue Zoloft 100 mg.  Trial of Wellbutrin 150 mg  Relevant Medications   buPROPion (WELLBUTRIN XL) 150 MG 24 hr tablet   Fatigue - Primary    Etiology of fatigue is nonspecific at this time.  Suspect multifactorial.  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  She will let me know if I need to order.  For now we opted to order thorough lab investigation for metabolic reason for fatigue.  Encouraged exercise.      Relevant Orders   TSH   CBC with Differential/Platelet   Comprehensive metabolic panel   Hemoglobin A1c   VITAMIN D 25 Hydroxy (Vit-D Deficiency, Fractures)   B12 and Folate Panel   Prediabetes    Patient has not lost weight on metformin despite taking maximum dose of 1000 mg twice daily.  Insurance did not approve MLYYTK.  We agreed to trial Wellbutrin 150 mg which she has been on the past to curb appetite.  She will increase to 300 mg after couple weeks if tolerating.      Relevant Medications   buPROPion (WELLBUTRIN XL) 150 MG 24 hr tablet     I have discontinued Pat Patrick. I am also having her start on buPROPion. Additionally, I am having her maintain her triamcinolone ointment, etonogestrel-ethinyl estradiol, sertraline, Prenatal MV-Min-Fe Fum-FA-DHA (PRENATAL+DHA PO), ibuprofen, EPINEPHrine, loratadine, famotidine, valACYclovir, and metFORMIN.   Meds ordered this encounter  Medications   buPROPion (WELLBUTRIN XL) 150 MG 24 hr tablet    Sig: Take 1 tablet (150 mg total) by mouth daily. After 1 to 2 weeks may increase to 2 tablets (321m) evey morning    Dispense:  120 tablet    Refill:  2    Order Specific Question:   Supervising Provider    Answer:   TCrecencio Mc[[id number]]    Return precautions  given.   Risks, benefits, and alternatives of the medications and treatment plan prescribed today were discussed, and patient expressed understanding.   Education regarding symptom management and diagnosis given to patient on AVS.  Continue to follow with ABurnard Hawthorne FNP for routine health maintenance.   DVerlin Festerand I agreed with plan.   MMable Paris FNP

## 2022-03-02 NOTE — Assessment & Plan Note (Signed)
Patient has not lost weight on metformin despite taking maximum dose of 1000 mg twice daily.  Insurance did not approve QASUOR.  We agreed to trial Wellbutrin 150 mg which she has been on the past to curb appetite.  She will increase to 300 mg after couple weeks if tolerating.

## 2022-03-02 NOTE — Assessment & Plan Note (Signed)
Chronic, stable.  Continue Zoloft 100 mg.  Trial of Wellbutrin 150 mg

## 2022-03-02 NOTE — Assessment & Plan Note (Signed)
Etiology of fatigue is nonspecific at this time.  Suspect multifactorial.  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  She will let me know if I need to order.  For now we opted to order thorough lab investigation for metabolic reason for fatigue.  Encouraged exercise.

## 2022-03-03 ENCOUNTER — Other Ambulatory Visit: Payer: Self-pay

## 2022-03-03 LAB — COMPREHENSIVE METABOLIC PANEL
ALT: 19 U/L (ref 0–35)
AST: 20 U/L (ref 0–37)
Albumin: 4 g/dL (ref 3.5–5.2)
Alkaline Phosphatase: 90 U/L (ref 39–117)
BUN: 12 mg/dL (ref 6–23)
CO2: 23 mEq/L (ref 19–32)
Calcium: 9.3 mg/dL (ref 8.4–10.5)
Chloride: 104 mEq/L (ref 96–112)
Creatinine, Ser: 0.8 mg/dL (ref 0.40–1.20)
GFR: 91.62 mL/min (ref >60.00)
Glucose, Bld: 84 mg/dL (ref 70–99)
Potassium: 4.3 mEq/L (ref 3.5–5.1)
Sodium: 137 mEq/L (ref 135–145)
Total Bilirubin: 0.2 mg/dL (ref 0.2–1.2)
Total Protein: 7.3 g/dL (ref 6.0–8.3)

## 2022-03-03 LAB — CBC WITH DIFFERENTIAL/PLATELET
Basophils Absolute: 0.1 10*3/uL (ref 0.0–0.1)
Basophils Relative: 0.8 % (ref 0.0–3.0)
Eosinophils Absolute: 0.2 10*3/uL (ref 0.0–0.7)
Eosinophils Relative: 2.3 % (ref 0.0–5.0)
HCT: 39.3 % (ref 36.0–46.0)
Hemoglobin: 13.2 g/dL (ref 12.0–15.0)
Lymphocytes Relative: 28.3 % (ref 12.0–46.0)
Lymphs Abs: 2.3 10*3/uL (ref 0.7–4.0)
MCHC: 33.4 g/dL (ref 30.0–36.0)
MCV: 87.2 fl (ref 78.0–100.0)
Monocytes Absolute: 0.6 10*3/uL (ref 0.1–1.0)
Monocytes Relative: 7.6 % (ref 3.0–12.0)
Neutro Abs: 5 10*3/uL (ref 1.4–7.7)
Neutrophils Relative %: 61 % (ref 43.0–77.0)
Platelets: 375 10*3/uL (ref 150.0–400.0)
RBC: 4.51 Mil/uL (ref 3.87–5.11)
RDW: 13.3 % (ref 11.5–15.5)
WBC: 8.3 10*3/uL (ref 4.0–10.5)

## 2022-03-03 LAB — VITAMIN D 25 HYDROXY (VIT D DEFICIENCY, FRACTURES): VITD: 27.71 ng/mL — ABNORMAL LOW (ref 30.00–100.00)

## 2022-03-03 LAB — TSH: TSH: 1.77 u[IU]/mL (ref 0.35–5.50)

## 2022-03-03 LAB — B12 AND FOLATE PANEL
Folate: 16.4 ng/mL (ref >5.9)
Vitamin B-12: 396 pg/mL (ref 211–911)

## 2022-03-03 LAB — HEMOGLOBIN A1C: Hgb A1c MFr Bld: 5.7 % (ref 4.6–6.5)

## 2022-03-22 ENCOUNTER — Other Ambulatory Visit: Payer: Self-pay | Admitting: Family

## 2022-03-22 ENCOUNTER — Other Ambulatory Visit: Payer: Self-pay

## 2022-03-22 ENCOUNTER — Encounter: Payer: Self-pay | Admitting: Family

## 2022-03-22 DIAGNOSIS — B009 Herpesviral infection, unspecified: Secondary | ICD-10-CM

## 2022-03-23 ENCOUNTER — Other Ambulatory Visit: Payer: Self-pay | Admitting: Family

## 2022-03-23 ENCOUNTER — Other Ambulatory Visit: Payer: Self-pay

## 2022-03-23 DIAGNOSIS — B009 Herpesviral infection, unspecified: Secondary | ICD-10-CM

## 2022-03-23 MED ORDER — ETONOGESTREL-ETHINYL ESTRADIOL 0.12-0.015 MG/24HR VA RING
1.0000 | VAGINAL_RING | VAGINAL | 0 refills | Status: DC
  Filled 2022-03-23 – 2022-04-04 (×2): qty 3, 84d supply, fill #0

## 2022-03-23 MED FILL — Valacyclovir HCl Tab 1 GM: ORAL | 90 days supply | Qty: 90 | Fill #0 | Status: CN

## 2022-04-01 ENCOUNTER — Other Ambulatory Visit: Payer: Self-pay

## 2022-04-04 ENCOUNTER — Other Ambulatory Visit: Payer: Self-pay

## 2022-04-04 MED FILL — Valacyclovir HCl Tab 1 GM: ORAL | 90 days supply | Qty: 90 | Fill #0 | Status: AC

## 2022-04-06 ENCOUNTER — Telehealth: Payer: 59 | Admitting: Family

## 2022-04-19 ENCOUNTER — Other Ambulatory Visit (HOSPITAL_COMMUNITY): Payer: Self-pay

## 2022-04-19 ENCOUNTER — Ambulatory Visit (INDEPENDENT_AMBULATORY_CARE_PROVIDER_SITE_OTHER): Payer: Commercial Managed Care - PPO | Admitting: Family

## 2022-04-19 ENCOUNTER — Encounter: Payer: Self-pay | Admitting: Family

## 2022-04-19 ENCOUNTER — Telehealth: Payer: Self-pay

## 2022-04-19 VITALS — BP 128/80 | HR 106 | Temp 97.8°F | Ht 62.0 in | Wt 221.8 lb

## 2022-04-19 DIAGNOSIS — E538 Deficiency of other specified B group vitamins: Secondary | ICD-10-CM | POA: Diagnosis not present

## 2022-04-19 DIAGNOSIS — F32A Depression, unspecified: Secondary | ICD-10-CM | POA: Diagnosis not present

## 2022-04-19 DIAGNOSIS — F419 Anxiety disorder, unspecified: Secondary | ICD-10-CM

## 2022-04-19 DIAGNOSIS — Z6835 Body mass index (BMI) 35.0-35.9, adult: Secondary | ICD-10-CM

## 2022-04-19 DIAGNOSIS — E66812 Obesity, class 2: Secondary | ICD-10-CM

## 2022-04-19 DIAGNOSIS — Z6841 Body Mass Index (BMI) 40.0 and over, adult: Secondary | ICD-10-CM

## 2022-04-19 DIAGNOSIS — R7303 Prediabetes: Secondary | ICD-10-CM

## 2022-04-19 MED ORDER — BUPROPION HCL ER (XL) 300 MG PO TB24: 300.0000 mg | ORAL_TABLET | Freq: Every morning | ORAL | 3 refills | Status: DC

## 2022-04-19 MED ORDER — "NEEDLE (DISP) 25G X 1"" MISC": 0 refills | Status: DC

## 2022-04-19 MED ORDER — CYANOCOBALAMIN 1000 MCG/ML IJ SOLN: INTRAMUSCULAR | 15 refills | Status: DC

## 2022-04-19 MED ORDER — ZEPBOUND 2.5 MG/0.5ML ~~LOC~~ SOAJ: 2.5000 mg | SUBCUTANEOUS | 1 refills | Status: DC

## 2022-04-19 MED ORDER — "SYRINGE 25G X 1"" 3 ML MISC": 0 refills | Status: AC

## 2022-04-19 NOTE — Patient Instructions (Addendum)
You may start b12 injections, once per week for four total weeks, and then monthly for the months thereafter.  As discussed, you may continue B12 injections monthly indefinitely if this is helpful for fatigue.  If you found this not to be helpful, I would recommend to stopping B12 and we can periodically check B12 levels. Please decrease Zoloft from 100 mg to 50 mg and take 50 mg every night for 1 week.  The following week may take a Zoloft 2 mg every other day and then stop medication completely   start Zepbound 2.5mg  once per week injected subcutaneously ( [place])  in stomach. Please clean with alcohol swab prior to injection and be sure to rotate site. You may schedule a nurse visit if you would like to first injection.   After 4 weeks, and if tolerated and weight loss has not reached 1-2 lbs per week, please increase to 5mg  once per week [place].  Once you are actively losing weight, you do not need to further increase medication  Dose increments are below.   7.5 mg/0.5 mL (0.5 mL) 10 mg/0.5 mL (0.5 mL) 12.5 mg/0.5 mL (0.5 mL) 15 mg/0.5 mL (0.5 mL)   Please read information on medication below and remember black box warning that you may not take if you or a family member is diagnosed with thyroid cancer (medullary thyroid cancer), or multiple endocrine neoplasia.       Brand Names: [place] Mounjaro; Zepbound Brand Names: [place] Mounjaro Warning  This drug has been shown to cause thyroid cancer in some animals. It is not known if this happens in humans. If thyroid cancer happens, it may be deadly if not found and treated early. Call your doctor right away if you have a neck mass, trouble breathing, trouble swallowing, or have hoarseness that will not go away.  Do not use this drug if you have a health problem called Multiple Endocrine Neoplasia syndrome type 2 (MEN 2), or if you or a family member have had thyroid cancer.  Have your blood work checked and thyroid ultrasounds as you have been told by  your doctor. What is this drug used for?  It is used to lower blood sugar in people with type 2 diabetes.  It is used to help with weight loss in certain people. What do I need to tell my doctor BEFORE I take this drug? All products:  If you are allergic to this drug; any part of this drug; or any other drugs, foods, or substances. Tell your doctor about the allergy and what signs you had.  If you have ever had pancreatitis.  If you have stomach or bowel problems.  If you are using another drug that has the same drug in it.  If you are using another drug like this one. If you are not sure, ask your doctor or pharmacist. If you are using this drug for diabetes:  If you have type 1 diabetes. Do not use this drug to treat type 1 diabetes. Zepbound:  If you have or have ever had depression or thoughts of suicide. This is not a list of all drugs or health problems that interact with this drug. Tell your doctor and pharmacist about all of your drugs (prescription or OTC, natural products, vitamins) and health problems. You must check to make sure that it is safe for you to take this drug with all of your drugs and health problems. Do not start, stop, or change the dose of any drug  without checking with your doctor. What are some things I need to know or do while I take this drug? All products:  Tell all of your health care providers that you take this drug. This includes your doctors, nurses, pharmacists, and dentists.  Follow the diet and workout plan that your doctor told you about.  Talk with your doctor before you drink alcohol.  Birth control pills may not work as well to prevent pregnancy. If you take birth control pills, you may need to switch to another type of hormone-based birth control like a vaginal ring if your doctor tells you to. If another type of hormone-based birth control is not an option, use some other kind of birth control also, like a condom. Do this for 4 weeks after starting  this drug and for 4 weeks each time the dose is raised.  This drug may prevent other drugs taken by mouth from getting into the body. If you take other drugs by mouth, you may need to take them at some other time than this drug. Talk with your doctor.  Do not share with another person even if the needle has been changed. Sharing your tray or pen may pass infections from one person to another. This includes infections you may not know you have.  If you cannot drink liquids by mouth or if you have upset stomach, throwing up, or diarrhea that does not go away; you need to avoid getting dehydrated. Contact your doctor to find out what to do. Dehydration may lead to low blood pressure or to new or worse kidney problems.  A severe and sometimes deadly pancreas problem (pancreatitis) has happened with other drugs like this one. If you are using this drug for diabetes:  It may be harder to control blood sugar during times of stress such as fever, infection, injury, or surgery. A change in physical activity, exercise, or diet may also affect blood sugar.  Check your blood sugar as you have been told by your doctor.  Do not drive if your blood sugar has been low. There is a greater chance of you having a crash.  Wear disease medical alert ID (identification).  Tell your doctor if you are pregnant, plan on getting pregnant, or are breast-feeding. You will need to talk about the benefits and risks to you and the baby. Zepbound:  If you have high blood sugar (diabetes), you will need to watch your blood sugar closely.  Weight loss during pregnancy may cause harm to the unborn baby. If you get pregnant while taking this drug or if you want to get pregnant, call your doctor right away.  Tell your doctor if you are breast-feeding. You will need to talk about any risks to your baby. What are some side effects that I need to call my doctor about right away? WARNING/CAUTION: Even though it may be rare, some people may  have very bad and sometimes deadly side effects when taking a drug. Tell your doctor or get medical help right away if you have any of the following signs or symptoms that may be related to a very bad side effect: All products:  Signs of an allergic reaction, like rash; hives; itching; red, swollen, blistered, or peeling skin with or without fever; wheezing; tightness in the chest or throat; trouble breathing, swallowing, or talking; unusual hoarseness; or swelling of the mouth, face, lips, tongue, or throat.  Signs of kidney problems like unable to pass urine, change in how much urine  is passed, blood in the urine, or a big weight gain.  Signs of gallbladder problems like pain in the upper right belly area, right shoulder area, or between the shoulder blades; yellow skin or eyes; fever with chills; bloating; or very upset stomach or throwing up.  Signs of a pancreas problem (pancreatitis) like very bad stomach pain, very bad back pain, or very bad upset stomach or throwing up.  Dizziness or passing out.  A fast heartbeat.  Change in eyesight.  Low blood sugar can happen. The chance may be raised when this drug is used with other drugs for diabetes. Signs may be dizziness, headache, feeling sleepy or weak, shaking, fast heartbeat, confusion, hunger, or sweating. Call your doctor right away if you have any of these signs. Follow what you have been told to do for low blood sugar. This may include taking glucose tablets, liquid glucose, or some fruit juices. Zepbound:  New or worse behavior or mood changes like depression or thoughts of suicide. What are some other side effects of this drug? All drugs may cause side effects. However, many people have no side effects or only have minor side effects. Call your doctor or get medical help if any of these side effects or any other side effects bother you or do not go away: All products:  Constipation, diarrhea, stomach pain, upset stomach, throwing up, or  decreased appetite.  Heartburn.  Pain, itching, or other irritation where the injection was given. Zepbound:  Feeling tired or weak. These are not all of the side effects that may occur. If you have questions about side effects, call your doctor. Call your doctor for medical advice about side effects. You may report side effects to your national health agency. How is this drug best taken? Use this drug as ordered by your doctor. Read all information given to you. Follow all instructions closely. All products:  It is given as a shot into the fatty part of the skin on the top of the thigh, belly area, or upper arm.  If you will be giving yourself the shot, your doctor or nurse will teach you how to give the shot.  Keep taking this drug as you have been told by your doctor or other health care provider, even if you feel well.  Take the same day each week.  Move site where you give the shot each time.  Take with or without food.  Wash your hands before and after use.  Do not use if the solution is leaking or has particles.  This drug is colorless to a faint yellow. Do not use if the solution changes color.  Do not move this drug from the pen to a syringe.  Each pen or vial is for 1 use only. Throw away any part of the used pen after the dose is given.  Throw away needles in a needle/sharp disposal box. Do not reuse needles or other items. When the box is full, follow all local rules for getting rid of it. Talk with a doctor or pharmacist if you have any questions. If you are using this drug for diabetes:  If you are also using insulin, you may inject this drug and the insulin in the same area of the body but not right next to each other.  Do not mix this drug in the same syringe with insulin. What do I do if I miss a dose?  If it is within 4 days after the missed dose, take  the missed dose and go back to your normal day.  If it has been more than 4 days since the missed dose, skip the missed  dose and go back to your normal day.  Do not take 2 doses at the same time or extra doses. How do I store and/or throw out this drug?  Store in a refrigerator. Do not freeze.  Do not use if it has been frozen.  If needed, each pen or vial may be stored at room temperature for up to 21 days. If you store at room temperature, throw away any part not used after 21 days.  Protect from heat.  Store in the original container to protect from light.  Keep all drugs in a safe place. Keep all drugs out of the reach of children and pets.  Throw away unused or expired drugs. Do not flush down a toilet or pour down a drain unless you are told to do so. Check with your pharmacist if you have questions about the best way to throw out drugs. There may be drug take-back programs in your area. General drug facts  If your symptoms or health problems do not get better or if they become worse, call your doctor.  Do not share your drugs with others and do not take anyone else's drugs.  Some drugs may have another patient information leaflet. If you have any questions about this drug, please talk with your doctor, nurse, pharmacist, or other health care provider.  If you think there has been an overdose, call your poison control center or get medical care right away. Be ready to tell or show what was taken, how much, and when it happened. Last Reviewed Date 2022-03-04 Consumer Information Use and Disclaimer This generalized information is a limited summary of diagnosis, treatment, and/or medication information. It is not meant to be comprehensive and should be used as a tool to help the user understand and/or assess potential diagnostic and treatment options. It does NOT include all information about conditions, treatments, medications, side effects, or risks that may apply to a specific patient. It is not intended to be medical advice or a substitute for the medical advice, diagnosis, or treatment of a health care  provider based on the health care provider's examination and assessment of a patient's specific and unique circumstances. Patients must speak with a health care provider for complete information about their health, medical questions, and treatment options, including any risks or benefits regarding use of medications. This information does not endorse any treatments or medications as safe, effective, or approved for treating a specific patient. UpToDate, Inc. and its affiliates disclaim any warranty or liability relating to this information or the use thereof. The use of this information is governed by the Terms of Use, available at https://www.wolterskluwer.com/en/know/clinical-effectiveness-terms.  [place]. and its affiliates and/or [place]. All rights reserved. Use of UpToDate is subject to the Terms of Use. Topic [id number] Version 16.0

## 2022-04-19 NOTE — Assessment & Plan Note (Signed)
History of prediabetes, hyperlipidemia.  Start zepbound.  Counseled on side effects, administration.  Discontinue metformin

## 2022-04-19 NOTE — Telephone Encounter (Signed)
Noted  

## 2022-04-19 NOTE — Assessment & Plan Note (Signed)
B12 396.  In the setting fatigue, we agreed a trial of B12 is appropriate.  Advised if she derives therapeutic benefit and increased energy from B12 she may continue ; otherwise she may discontinue B12.  Of note, she is pursuing sleep evaluation with pulmonology.

## 2022-04-19 NOTE — Assessment & Plan Note (Signed)
Wean off of Zoloft.  Continue wellbutrin 300mg .

## 2022-04-19 NOTE — Assessment & Plan Note (Signed)
We agreed to trial wean off of Zoloft as concerned it may be interfering with weight loss.  Continue Wellbutrin 300 mg.

## 2022-04-19 NOTE — Progress Notes (Signed)
Assessment & Plan:  B12 deficiency Assessment & Plan: B12 42.  In the setting fatigue, we agreed a trial of B12 is appropriate.  Advised if she derives therapeutic benefit and increased energy from B12 she may continue ; otherwise she may discontinue B12.  Of note, she is pursuing sleep evaluation with pulmonology.   Orders: -     Cyanocobalamin; 1000 mcg (1 mL) intramuscular injection in the thigh ( vastus lateralis) once per week for four weeks, followed by 1000 mcg IM injection once per month.  Dispense: 1 mL; Refill: 15 -     Syringe; Use to give IM B12 injection  Dispense: 16 each; Refill: 0 -     Needle (Disp); Use to give IM b12 injection  Dispense: 16 each; Refill: 0  Prediabetes  Class 2 severe obesity with serious comorbidity and body mass index (BMI) of 35.0 to 35.9 in adult, unspecified obesity type [facility]) Assessment & Plan: History of prediabetes, hyperlipidemia.  Start zepbound.  Counseled on side effects, administration.  Discontinue metformin  Orders: -     Zepbound; Inject 2.5 mg into the skin once a week.  Dispense: 2 mL; Refill: 1  Anxiety and depression Assessment & Plan: Wean off of Zoloft.  Continue wellbutrin 300mg .   Orders: -     buPROPion HCl ER (XL); Take 1 tablet (300 mg total) by mouth every morning.  Dispense: 90 tablet; Refill: 3  BMI 40.0-44.9, adult [facility]) Assessment & Plan: History of prediabetes, hyperlipidemia.  Start zepbound.  Counseled on side effects, administration.  Discontinue metformin      Return precautions given.   Risks, benefits, and alternatives of the medications and treatment plan prescribed today were discussed, and patient expressed understanding.   Education regarding symptom management and diagnosis given to patient on AVS either electronically or printed.  No follow-ups on file.  [name], FNP  Subjective:    Patient ID: Angie Tucker, female    DOB: 1980/10/30, 42 y.o.   MRN: [id number]  CC: Angie Tucker is a 42 y.o. female who presents today for follow up.   HPI: Feels well today.  Continues to have fatigue, though feels improved.  She spoken with Dr. Mortimer Fries of pulmonology who is arranging home sleep study for patient.  She is interested in starting Zepbound for weight loss.  No personal or family history of thyroid cancer, multiple endocrine neoplasia.   Compliant with Zoloft 100 mg and she is pleased with Wellbutrin 300 mg.  She is interested in weaning off of Zoloft as concerned it may be interfering with  weight loss  She is not a vegetarian.  Allergies: Food and Amoxicillin Current Outpatient Medications on File Prior to Visit  Medication Sig Dispense Refill   EPINEPHrine 0.3 mg/0.3 mL IJ SOAJ injection Inject 0.3 mg into the muscle as needed for anaphylaxis. 2 each 2   etonogestrel-ethinyl estradiol (NUVARING) 0.12-0.015 MG/24HR vaginal ring Place 1 ring vaginally every month. Patient needs annual exam appointment 3 each 0   famotidine (PEPCID) 40 MG tablet Take 1 tablet (40 mg total) by mouth daily. 30 tablet 5   ibuprofen (ADVIL) 600 MG tablet Take 1 tablet (600 mg total) by mouth every 6 (six) hours. 30 tablet 0   loratadine (CLARITIN) 10 MG tablet Take 1 tablet (10 mg total) by mouth 2 (two) times daily. 60 tablet 5   Prenatal MV-Min-Fe Fum-FA-DHA (PRENATAL+DHA PO) Take 1 tablet by mouth in the morning.     triamcinolone ointment (KENALOG)  0.1 % Apply topically to affected areas twice daily as needed for itch   Avoid applying to face, groin, and axilla. Use as directed. 80 g 1   valACYclovir (VALTREX) 1000 MG tablet Take 1 tablet (1,000 mg total) by mouth daily. 90 tablet 2   [DISCONTINUED] misoprostol (CYTOTEC) 200 MCG tablet Place four tablets in between your gums and cheeks (two tablets on each side) as instructed OR insert four tablets vaginally, repeat every 6-hrs as needed (Patient not taking: Reported on 04/13/2020) 12 tablet 0   No current facility-administered  medications on file prior to visit.    Review of Systems  Constitutional:  Positive for fatigue. Negative for chills and fever.  Respiratory:  Negative for cough.   Cardiovascular:  Negative for chest pain and palpitations.  Gastrointestinal:  Negative for nausea and vomiting.      Objective:    BP 128/80   Pulse (!) 106   Temp 97.8 F (36.6 C) (Oral)   Ht 5\' 2"  (1.575 m)   Wt 221 lb 12.8 oz (100.6 kg)   SpO2 99%   BMI 40.57 kg/m  BP Readings from Last 3 Encounters:  04/19/22 128/80  03/02/22 126/78  12/07/21 122/80   Wt Readings from Last 3 Encounters:  04/19/22 221 lb 12.8 oz (100.6 kg)  03/02/22 217 lb 6.4 oz (98.6 kg)  01/11/22 208 lb 3.2 oz (94.4 kg)    Physical Exam Vitals reviewed.  Constitutional:      Appearance: She is well-developed.  Eyes:     Conjunctiva/sclera: Conjunctivae normal.  Cardiovascular:     Rate and Rhythm: Normal rate and regular rhythm.     Pulses: Normal pulses.     Heart sounds: Normal heart sounds.  Pulmonary:     Effort: Pulmonary effort is normal.     Breath sounds: Normal breath sounds. No wheezing, rhonchi or rales.  Skin:    General: Skin is warm and dry.  Neurological:     Mental Status: She is alert.  Psychiatric:        Speech: Speech normal.        Behavior: Behavior normal.        Thought Content: Thought content normal.

## 2022-04-19 NOTE — Telephone Encounter (Signed)
Pharmacy Patient Advocate Encounter   Received notification from [facility] that prior authorization for Zepbound 2.5mg /0.72ml is required/requested.  Per Test Claim: prior auth required   PA submitted on 04/19/22 to (ins) [employer] via CoverMyMeds Key [id number] Status is pending

## 2022-04-21 ENCOUNTER — Encounter: Payer: Self-pay | Admitting: Family

## 2022-04-28 NOTE — Telephone Encounter (Signed)
Followed up on Zepbound prior auth and submitted additional questions to [employer]	[place] via fax at [phone].

## 2022-04-29 ENCOUNTER — Ambulatory Visit: Payer: Self-pay | Admitting: Nurse Practitioner

## 2022-04-29 NOTE — Telephone Encounter (Signed)
Spoke to pt and she stated that they had approved the PA for Zepound.

## 2022-04-29 NOTE — Telephone Encounter (Signed)
We are still waiting to hear back from insurance. Please be aware, insurance might take a little longer to review PA's this time of year.

## 2022-05-05 NOTE — Telephone Encounter (Signed)
LVM to inform pt that the PA for Zepbound was approved as 04/27/22-10/24/2022. If you have further questions please give [place] a call.

## 2022-05-05 NOTE — Telephone Encounter (Signed)
LVM to inform pt that Pharm d team has  Followed up on Zepbound prior auth and submitted additional questions to [employer]	[place] via fax at [phone].

## 2022-05-09 NOTE — Telephone Encounter (Signed)
Spoke to pt and she is aware.

## 2022-05-09 NOTE — Telephone Encounter (Signed)
You are welcome

## 2022-05-09 NOTE — Telephone Encounter (Signed)
LVM to call back but sent message through my chart as well

## 2022-05-16 ENCOUNTER — Other Ambulatory Visit: Payer: Self-pay

## 2022-05-16 ENCOUNTER — Encounter: Payer: Self-pay | Admitting: Family

## 2022-05-16 MED ORDER — ZEPBOUND 5 MG/0.5ML ~~LOC~~ SOAJ
5.0000 mg | SUBCUTANEOUS | 2 refills | Status: DC
  Filled 2022-05-16: qty 2, 28d supply, fill #0

## 2022-05-27 ENCOUNTER — Encounter: Payer: Self-pay | Admitting: Internal Medicine

## 2022-05-27 ENCOUNTER — Ambulatory Visit: Payer: Commercial Managed Care - PPO | Admitting: Internal Medicine

## 2022-05-27 VITALS — BP 140/82 | HR 74 | Temp 97.1°F | Ht 62.0 in | Wt 217.4 lb

## 2022-05-27 DIAGNOSIS — G4710 Hypersomnia, unspecified: Secondary | ICD-10-CM | POA: Insufficient documentation

## 2022-05-27 NOTE — Patient Instructions (Signed)
I will consult one of our sleep docs re best choice and we call you to set it up and follow up with [facility] or [place]

## 2022-05-27 NOTE — Progress Notes (Signed)
Angie Tucker, female    DOB: November 07, 1980   MRN: [id number]   Brief patient profile:  3  yobf  never smoker  referred to pulmonary clinic in [facility]  05/27/2022 by [name]   for ? OSA         History of Present Illness  05/27/2022  Pulmonary/ 1st office eval/ [facility]  Chief Complaint  Patient presents with   Consult    Snores. No gasping for breath. Excessive tiredness. No previous sleep study.   Dyspnea:  treadmill 3-4 x per week at 3.5-3.7 mph slt tilt x 30-40 min  Cough: none  Sleep: wakes up with HA that improves/ on side one pillow  Ready to go back to sleep as soon as she gets up  Pattern was present prior to IUP and now she is 68 m post partum s change in cc  Husband not aware of any apneic spells  SABA use: none   No obvious day to day or daytime pattern/variability or assoc excess/ purulent sputum or mucus plugs or hemoptysis or cp or chest tightness, subjective wheeze or overt sinus or hb symptoms.    Also denies any obvious fluctuation of symptoms with weather or environmental changes or other aggravating or alleviating factors except as outlined above   No unusual exposure hx or h/o childhood pna/ asthma or knowledge of premature birth.  Current Allergies, Complete Past Medical History, Past Surgical History, Family History, and Social History were reviewed in [employer] record.  ROS  The following are not active complaints unless bolded Hoarseness, sore throat, dysphagia, dental problems, itching, sneezing,  nasal congestion or discharge of excess mucus or purulent secretions, ear ache,   fever, chills, sweats, unintended wt loss or wt gain, classically pleuritic or exertional cp,  orthopnea pnd or arm/hand swelling  or leg swelling, presyncope, palpitations, abdominal pain, anorexia, nausea, vomiting, diarrhea  or change in bowel habits or change in bladder habits, change in stools or change in urine, dysuria,  hematuria,  rash, arthralgias, visual complaints, headache, numbness, weakness or ataxia or problems with walking or coordination,  change in mood or  memory.            Past Medical History:  Diagnosis Date   Anxiety    BRCA negative 03/2018   MyRisk neg   Broken wrist Right Wrist   Depression    Depression    Eczema    Family history of BRCA gene positive    PGM with ovarian cancer   Family history of breast cancer    Family history of ovarian cancer    Family history of pancreatic cancer    Herpes    Pregnancy induced hypertension     Outpatient Medications Prior to Visit  Medication Sig Dispense Refill   buPROPion (WELLBUTRIN XL) 300 MG 24 hr tablet Take 1 tablet (300 mg total) by mouth every morning. 90 tablet 3   cyanocobalamin (VITAMIN B12) 1000 MCG/ML injection 1000 mcg (1 mL) intramuscular injection in the thigh ( vastus lateralis) once per week for four weeks, followed by 1000 mcg IM injection once per month. 1 mL 15   EPINEPHrine 0.3 mg/0.3 mL IJ SOAJ injection Inject 0.3 mg into the muscle as needed for anaphylaxis. 2 each 2   etonogestrel-ethinyl estradiol (NUVARING) 0.12-0.015 MG/24HR vaginal ring Place 1 ring vaginally every month. Patient needs annual exam appointment 3 each 0   famotidine (PEPCID) 40 MG tablet Take 1 tablet (  40 mg total) by mouth daily. 30 tablet 5   loratadine (CLARITIN) 10 MG tablet Take 1 tablet (10 mg total) by mouth 2 (two) times daily. 60 tablet 5   NEEDLE, DISP, 25 G 25G X 1" MISC Use to give IM b12 injection 16 each 0   Prenatal MV-Min-Fe Fum-FA-DHA (PRENATAL+DHA PO) Take 1 tablet by mouth in the morning.     Syringe/Needle, Disp, (SYRINGE 3CC/25GX1") 25G X 1" 3 ML MISC Use to give IM B12 injection 16 each 0   tirzepatide (ZEPBOUND) 5 MG/0.5ML Pen Inject 5 mg into the skin once a week. 2 mL 2   triamcinolone ointment (KENALOG) 0.1 % Apply topically to affected areas twice daily as needed for itch   Avoid applying to face, groin, and  axilla. Use as directed. 80 g 1   valACYclovir (VALTREX) 1000 MG tablet Take 1 tablet (1,000 mg total) by mouth daily. 90 tablet 2   ibuprofen (ADVIL) 600 MG tablet Take 1 tablet (600 mg total) by mouth every 6 (six) hours. 30 tablet 0   No facility-administered medications prior to visit.     Objective:     BP (!) 140/82 (BP Location: Left Arm, Cuff Size: Large)   Pulse 74   Temp (!) 97.1 F (36.2 C)   Ht 5' 2"$  (1.575 m)   Wt 217 lb 6.4 oz (98.6 kg)   SpO2 99%   BMI 39.76 kg/m   SpO2: 99 %  Mod obese (by BMI) amb pleasant bf nad  HEENT : Oropharynx  M1 airway        NECK :  without  apparent JVD/ palpable Nodes/TM   LUNGS: no acc muscle use,  Nl contour chest which is clear to A and P bilaterally without cough on insp or exp maneuvers  CV:  RRR  no s3 or murmur or increase in P2, and no edema   ABD:  soft and nontender    NEURO:  alert, approp, nl sensorium with  no motor or cerebellar deficits apparent.       Assessment   No problem-specific Assessment & Plan notes found for this encounter.     Christinia Gully, MD 05/27/2022

## 2022-05-27 NOTE — Assessment & Plan Note (Signed)
Onset in her 30's  - Epworth score 05/27/2022 = 14  - Home sleep study 05/27/2022 >>>   Despite excellent airway on exam, she has classic hx for OSA x for absence of observed apneas so will proceed with HST and set up f/u with Dr Halford Chessman or [facility]   Discussed in detail all the  indications, usual  risks and alternatives  relative to the benefits with patient who agrees to proceed with w/u as outlined.     Etiology and pathophysiology of osa including relationship to obesity reviewed> working on wt loss    Each maintenance medication was reviewed in detail including emphasizing most importantly the difference between maintenance and prns and under what circumstances the prns are to be triggered using an action plan format where appropriate.  Total time for H and P, chart review, counseling, reviewing mask  device(s) and generating customized AVS unique to this office visit / same day charting = 30 min new pt eval

## 2022-06-09 ENCOUNTER — Other Ambulatory Visit: Payer: Self-pay

## 2022-06-09 MED FILL — Valacyclovir HCl Tab 1 GM: ORAL | 90 days supply | Qty: 90 | Fill #1 | Status: CN

## 2022-06-10 ENCOUNTER — Other Ambulatory Visit: Payer: Self-pay

## 2022-06-10 ENCOUNTER — Ambulatory Visit: Payer: Commercial Managed Care - PPO

## 2022-06-10 DIAGNOSIS — G4710 Hypersomnia, unspecified: Secondary | ICD-10-CM

## 2022-06-10 DIAGNOSIS — R0683 Snoring: Secondary | ICD-10-CM | POA: Diagnosis not present

## 2022-06-12 ENCOUNTER — Encounter: Payer: Self-pay | Admitting: Family

## 2022-06-13 ENCOUNTER — Other Ambulatory Visit: Payer: Self-pay

## 2022-06-13 MED ORDER — ZEPBOUND 7.5 MG/0.5ML ~~LOC~~ SOAJ
7.5000 mg | SUBCUTANEOUS | 1 refills | Status: DC
  Filled 2022-06-13: qty 2, 28d supply, fill #0
  Filled 2022-06-16 – 2022-06-27 (×2): qty 2, 28d supply, fill #1

## 2022-06-13 MED ORDER — SERTRALINE HCL 50 MG PO TABS
50.0000 mg | ORAL_TABLET | Freq: Every day | ORAL | 3 refills | Status: DC
  Filled 2022-06-13: qty 90, 90d supply, fill #0
  Filled 2022-06-26: qty 90, 90d supply, fill #1

## 2022-06-13 NOTE — Telephone Encounter (Signed)
sent 

## 2022-06-15 ENCOUNTER — Encounter: Payer: Self-pay | Admitting: Family

## 2022-06-16 ENCOUNTER — Other Ambulatory Visit: Payer: Self-pay

## 2022-06-17 DIAGNOSIS — R0683 Snoring: Secondary | ICD-10-CM | POA: Diagnosis not present

## 2022-06-23 ENCOUNTER — Telehealth: Payer: Self-pay

## 2022-06-23 DIAGNOSIS — G4733 Obstructive sleep apnea (adult) (pediatric): Secondary | ICD-10-CM

## 2022-06-23 NOTE — Telephone Encounter (Signed)
Please refer to HST result note. Patient would like to follow up with Dr. Mortimer Fries. Received below message from Dr. Mortimer Fries via epic secure chat.  Order placed to [facility].  Dr. Mortimer Fries has made patient aware.  Nothing further needed  Hi Angie Tucker, patient had a sleep study but very symptomatic from excessive sleepiness... she needs auto Cpap 4-8 cm h2o with nasal pillows.

## 2022-06-24 ENCOUNTER — Other Ambulatory Visit: Payer: Self-pay

## 2022-06-24 ENCOUNTER — Telehealth: Payer: Commercial Managed Care - PPO | Admitting: Family

## 2022-06-24 ENCOUNTER — Encounter: Payer: Self-pay | Admitting: Family

## 2022-06-24 VITALS — Ht 62.0 in | Wt 217.4 lb

## 2022-06-24 DIAGNOSIS — Z6841 Body Mass Index (BMI) 40.0 and over, adult: Secondary | ICD-10-CM | POA: Diagnosis not present

## 2022-06-24 DIAGNOSIS — E669 Obesity, unspecified: Secondary | ICD-10-CM

## 2022-06-24 MED ORDER — NALTREXONE-BUPROPION HCL ER 8-90 MG PO TB12
ORAL_TABLET | ORAL | 2 refills | Status: DC
  Filled 2022-06-24: qty 120, 30d supply, fill #0
  Filled 2022-09-01: qty 70, 30d supply, fill #0

## 2022-06-24 MED ORDER — BUPROPION HCL ER (XL) 150 MG PO TB24
150.0000 mg | ORAL_TABLET | Freq: Every day | ORAL | 0 refills | Status: DC
  Filled 2022-06-24: qty 30, 30d supply, fill #0

## 2022-06-24 NOTE — Progress Notes (Signed)
Virtual Visit via Video Note  I connected with Angie Tucker on 06/28/22 at  1:30 PM EST by a video enabled telemedicine application and verified that I am speaking with the correct person using two identifiers. Location patient: home Location provider: work  Persons participating in the virtual visit: patient, provider  I discussed the limitations of evaluation and management by telemedicine and the availability of in person appointments. The patient expressed understanding and agreed to proceed.  HPI: She has lost 14lbs on Zepbound.  Unfortunately insurance has declined coverage going forward.  She is interested in [place].  She is currently on Wellbutrin 300 mg daily Denies seizure, alcohol abuse, opioid use or opiod addiction.   She checks blood pressure regularly.   ROS: See pertinent positives and negatives per HPI.  EXAM:  VITALS per patient if applicable: Ht '5\' 2"'$  (1.575 m)   Wt 217 lb 6.4 oz (98.6 kg)   LMP  (LMP Unknown)   BMI 39.76 kg/m  BP Readings from Last 3 Encounters:  05/27/22 (!) 140/82  04/19/22 128/80  03/02/22 126/78   Wt Readings from Last 3 Encounters:  06/24/22 217 lb 6.4 oz (98.6 kg)  05/27/22 217 lb 6.4 oz (98.6 kg)  04/19/22 221 lb 12.8 oz (100.6 kg)    GENERAL: alert, oriented, appears well and in no acute distress  HEENT: atraumatic, conjunttiva clear, no obvious abnormalities on inspection of external nose and ears  NECK: normal movements of the head and neck  LUNGS: on inspection no signs of respiratory distress, breathing rate appears normal, no obvious gross SOB, gasping or wheezing  CV: no obvious cyanosis  MS: moves all visible extremities without noticeable abnormality  PSYCH/NEURO: pleasant and cooperative, no obvious depression or anxiety, speech and thought processing grossly intact  ASSESSMENT AND PLAN: BMI 40.0-44.9, adult (HCC) Assessment & Plan: She has lost 14 pounds on Zepbound.  Unfortunately insurance no longer  covered.  We discussed Contrave as on her insurance formulary.   Currently on Wellbutrin '300mg'$  daily.  Wean off of by taking Wellbutrin 150 mg QD x 2 weeks and then Wellbutrin 150 mg every other day for 1 week and then discontinue.   no history of uncontrolled hypertension, seizure disorder, alcohol abuse, opioid use or opioid addiction.  Counseled on side effects including headache, insomnia.  Counseled on opioid reversal if she is ever prescribed an opioid for pain, codeine cough syrup.  She verbalized understanding of all.  Explained titration of Contrave. Will continue for 3 months. If 5% of bodyweight is not lost, will discontinue medication.   Orders: -     buPROPion HCl ER (XL); Take 1 tablet (150 mg total) by mouth daily.  Dispense: 30 tablet; Refill: 0 -     Naltrexone-buPROPion HCl ER; Start 1 tablet every morning for 7 days, then 1 tablet twice daily for 7 days, then 2 tablets every morning and one in the evening x 7 days, then 2 tablets every morning and 2 tablets every evening.  Dispense: 120 tablet; Refill: 2  Obesity (BMI 30.0-34.9) Assessment & Plan: She has lost 14 pounds on Zepbound.  Unfortunately insurance no longer covered.  We discussed Contrave as on her insurance formulary.   Currently on Wellbutrin '300mg'$  daily.  Wean off of by taking Wellbutrin 150 mg QD x 2 weeks and then Wellbutrin 150 mg every other day for 1 week and then discontinue.   no history of uncontrolled hypertension, seizure disorder, alcohol abuse, opioid use or opioid addiction.  Counseled  on side effects including headache, insomnia.  Counseled on opioid reversal if she is ever prescribed an opioid for pain, codeine cough syrup.  She verbalized understanding of all.  Explained titration of Contrave. Will continue for 3 months. If 5% of bodyweight is not lost, will discontinue medication.       -we discussed possible serious and likely etiologies, options for evaluation and workup, limitations of telemedicine  visit vs in person visit, treatment, treatment risks and precautions. Pt prefers to treat via telemedicine empirically rather then risking or undertaking an in person visit at this moment.    I discussed the assessment and treatment plan with the patient. The patient was provided an opportunity to ask questions and all were answered. The patient agreed with the plan and demonstrated an understanding of the instructions.   The patient was advised to call back or seek an in-person evaluation if the symptoms worsen or if the condition fails to improve as anticipated.  Advised if desired AVS can be mailed or viewed via [place] if [place] user.   [name], FNP

## 2022-06-24 NOTE — Patient Instructions (Addendum)
Please note you may not take this medication with a history of uncontrolled hypertension, seizure disorder, alcohol use, history of opioid abuse.  This medication will reserve a narcotic ; this includes a cough syrup with codeine use.  This medication is not safe in pregnancy   Start contrave:   One tablet (naltrexone 8 mg/bupropion 90 mg) once daily in the morning for 1 week; increase as tolerated in weekly intervals: 1 tablet twice daily for 1 week; then 2 tablets in the morning and 1 tablet in the evening for 1 week; and then 2 tablets twice daily (maximum dose: 4 tablets/day)  We will consider discontinuation if weight loss is less than 4% to 5% of baseline after 3 months.  Naltrexone; Bupropion Extended-Release Tablets What is this medication? NALTREXONE; BUPROPION (nal TREX one; byoo PROE pee on) promotes weight loss. It may also be used to maintain weight loss. It works by decreasing appetite. Changes to diet and exercise are often combined with this medication. This medicine may be used for other purposes; ask your health care provider or pharmacist if you have questions. COMMON BRAND NAME(S): Contrave What should I tell my care team before I take this medication? They need to know if you have any of these conditions: An eating disorder, such as anorexia or bulimia Diabetes Depression Frequently drink alcohol Glaucoma Head injury Heart disease High blood pressure History of a tumor or infection of your brain or spine History of heart attack or stroke History of irregular heartbeat History of substance use disorder or alcohol use disorder Kidney disease Liver disease Low levels of sodium in the blood Mental health condition Seizures Suicidal thoughts, plans, or attempt by you or a family member Taken an MAOI like Carbex, Eldepryl, Marplan, Nardil, or Parnate in last 14 days An unusual or allergic reaction to bupropion, naltrexone, other medications, foods, dyes, or  preservatives Breast-feeding Pregnant or trying to become pregnant How should I use this medication? Take this medication by mouth with a full glass of water. Take it as directed on the prescription label at the same time every day. Do not cut, crush, or chew this medication. Swallow the tablets whole. You can take it with or without food. Do not take with high-fat meals as this may increase your risk of seizures. Keep taking it unless your care team tells you to stop. A special MedGuide will be given to you by the pharmacist with each prescription and refill. Be sure to read this information carefully each time. Talk to your care team about the use of this medication in children. Special care may be needed. Overdosage: If you think you have taken too much of this medicine contact a poison control center or emergency room at once. NOTE: This medicine is only for you. Do not share this medicine with others. What if I miss a dose? If you miss a dose, skip it. Take your next dose at the normal time. Do not take extra or 2 doses at the same time to make up for the missed dose. What may interact with this medication? Do not take this medication with any of the following: Any medications used to stop taking opioids, such as methadone or buprenorphine Linezolid MAOIs, such as Carbex, Eldepryl, Marplan, Nardil, and Parnate Methylene blue (injected into a vein) Opioid medications Other medications that contain bupropion, such as Zyban or Wellbutrin This medication may also interact with the following: Alcohol Certain medications for blood pressure, such as metoprolol, propranolol Certain medications for  depression, anxiety, or mental health conditions Certain medications for HIV or hepatitis Certain medications for irregular heart beat, such as propafenone, flecainide Certain medications for Parkinson disease, such as amantadine, levodopa Certain medications for seizures, such as carbamazepine,  phenytoin, phenobarbital Certain medications for sleep Cimetidine Clopidogrel Cyclophosphamide Digoxin Disulfiram Furazolidone Isoniazid Nicotine Orphenadrine Procarbazine Steroid medications, such as prednisone or cortisone Stimulant medications for attention disorders, weight loss, or to stay awake Tamoxifen Theophylline Thiotepa Ticlopidine Tramadol Warfarin This list may not describe all possible interactions. Give your health care provider a list of all the medicines, herbs, non-prescription drugs, or dietary supplements you use. Also tell them if you smoke, drink alcohol, or use illegal drugs. Some items may interact with your medicine. What should I watch for while using this medication? Visit your care team for regular checks on your progress. This medication may cause serious skin reactions. They can happen weeks to months after starting the medication. Contact your care team right away if you notice fevers or flu-like symptoms with a rash. The rash may be red or purple and then turn into blisters or peeling of the skin. Or, you might notice a red rash with swelling of the face, lips, or lymph nodes in your neck or under your arms. This medication may affect blood sugar. Ask your care team if changes in diet or medications are needed if you have diabetes. Patients and their families should watch out for new or worsening depression or thoughts of suicide. This includes sudden changes in mood, behaviors, or thoughts. These changes can happen at any time but are more common in the beginning of treatment or after a change in dose. Call your care team right away if you experience these thoughts or worsening depression. Avoid alcoholic drinks while taking this medication. Drinking large amounts of alcoholic beverages, using sleeping or anxiety medications, or quickly stopping the use of these agents while taking this medication may increase your risk for a seizure. Do not drive or use  heavy machinery until you know how this medication affects you. This medication can impair your ability to perform these tasks. Inform your care team if you wish to become pregnant or think you might be pregnant. Losing weight while pregnant is not advised and may cause harm to the unborn child. Talk to your care team for more information. What side effects may I notice from receiving this medication? Side effects that you should report to your care team as soon as possible: Allergic reactions--skin rash, itching, hives, swelling of the face, lips, tongue, or throat Fast or irregular heartbeat Increase in blood pressure Liver injury--right upper belly pain, loss of appetite, nausea, light-colored stool, dark yellow or brown urine, yellowing skin or eyes, unusual weakness or fatigue Mood and behavior changes--anxiety, nervousness, confusion, hallucinations, irritability, hostility, thoughts of suicide or self-harm, worsening mood, feelings of depression Redness, blistering, peeling, or loosening of the skin, including inside the mouth Seizures Sudden eye pain or change in vision such as blurry vision, seeing halos around lights, vision loss Side effects that usually do not require medical attention (report to your care team if they continue or are bothersome): Constipation Dizziness Dry mouth Fatigue Headache Nausea Trouble sleeping Vomiting This list may not describe all possible side effects. Call your doctor for medical advice about side effects. You may report side effects to FDA at 1-800-FDA-1088. Where should I keep my medication? Keep out of the reach of children and pets. Store between 15 and 30 degrees C (59  and 86 degrees F). Get rid of any unused medication after the expiration date. To get rid of medications that are no longer needed or have expired: Take the medication to a medication take-back program. Check with your pharmacy or law enforcement to find a location. If you  cannot return the medication, check the label or package insert to see if the medication should be thrown out in the garbage or flushed down the toilet. If you are not sure, ask your care team. If it is safe to put it in the trash, pour the medication out of the container. Mix the medication with cat litter, dirt, coffee grounds, or other unwanted substance. Seal the mixture in a bag or container. Put it in the trash. NOTE: This sheet is a summary. It may not cover all possible information. If you have questions about this medicine, talk to your doctor, pharmacist, or health care provider.  2023 Elsevier/Gold Standard (2021-05-10 00:00:00)

## 2022-06-24 NOTE — Assessment & Plan Note (Addendum)
She has lost 14 pounds on Zepbound.  Unfortunately insurance no longer covered.  We discussed Contrave as on her insurance formulary.   Currently on Wellbutrin '300mg'$  daily.  Wean off of by taking Wellbutrin 150 mg QD x 2 weeks and then Wellbutrin 150 mg every other day for 1 week and then discontinue.   no history of uncontrolled hypertension, seizure disorder, alcohol abuse, opioid use or opioid addiction.  Counseled on side effects including headache, insomnia.  Counseled on opioid reversal if she is ever prescribed an opioid for pain, codeine cough syrup.  She verbalized understanding of all.  Explained titration of Contrave. Will continue for 3 months. If 5% of bodyweight is not lost, will discontinue medication.

## 2022-06-26 ENCOUNTER — Other Ambulatory Visit: Payer: Self-pay

## 2022-06-26 MED FILL — Valacyclovir HCl Tab 1 GM: ORAL | 90 days supply | Qty: 90 | Fill #1 | Status: AC

## 2022-06-27 ENCOUNTER — Other Ambulatory Visit: Payer: Self-pay

## 2022-06-29 NOTE — Telephone Encounter (Signed)
Received note from [employer] stated that AHI is below 5 and patient does not qualify for cpap. They are voiding order.  Dr. Mortimer Fries, how would you like to proceed?

## 2022-07-04 ENCOUNTER — Other Ambulatory Visit: Payer: Self-pay | Admitting: Family

## 2022-07-04 ENCOUNTER — Other Ambulatory Visit: Payer: Self-pay

## 2022-07-04 ENCOUNTER — Encounter: Payer: Self-pay | Admitting: Family

## 2022-07-04 DIAGNOSIS — E669 Obesity, unspecified: Secondary | ICD-10-CM

## 2022-07-04 MED ORDER — ZEPBOUND 10 MG/0.5ML ~~LOC~~ SOAJ
10.0000 mg | SUBCUTANEOUS | 3 refills | Status: DC
  Filled 2022-07-04 – 2022-07-06 (×3): qty 2, 28d supply, fill #0

## 2022-07-04 NOTE — Telephone Encounter (Signed)
Noted  

## 2022-07-05 ENCOUNTER — Other Ambulatory Visit: Payer: Self-pay

## 2022-07-06 ENCOUNTER — Other Ambulatory Visit: Payer: Self-pay

## 2022-07-18 ENCOUNTER — Encounter: Payer: Self-pay | Admitting: Family

## 2022-07-18 ENCOUNTER — Other Ambulatory Visit: Payer: Self-pay

## 2022-07-18 ENCOUNTER — Other Ambulatory Visit: Payer: Self-pay | Admitting: Family

## 2022-07-18 MED ORDER — ETONOGESTREL-ETHINYL ESTRADIOL 0.12-0.015 MG/24HR VA RING
1.0000 | VAGINAL_RING | VAGINAL | 1 refills | Status: DC
  Filled 2022-07-18: qty 3, 84d supply, fill #0
  Filled 2022-10-11: qty 3, 84d supply, fill #1

## 2022-08-02 ENCOUNTER — Encounter: Payer: Self-pay | Admitting: Family

## 2022-08-02 ENCOUNTER — Other Ambulatory Visit: Payer: Self-pay | Admitting: Family

## 2022-08-02 ENCOUNTER — Other Ambulatory Visit: Payer: Self-pay

## 2022-08-02 DIAGNOSIS — E669 Obesity, unspecified: Secondary | ICD-10-CM

## 2022-08-02 MED ORDER — SERTRALINE HCL 100 MG PO TABS
100.0000 mg | ORAL_TABLET | Freq: Every day | ORAL | 3 refills | Status: DC
  Filled 2022-08-02: qty 90, 90d supply, fill #0
  Filled 2022-11-28: qty 90, 90d supply, fill #1

## 2022-08-02 MED ORDER — ZEPBOUND 10 MG/0.5ML ~~LOC~~ SOAJ
10.0000 mg | SUBCUTANEOUS | 3 refills | Status: DC
  Filled 2022-08-02: qty 2, 28d supply, fill #0

## 2022-08-03 ENCOUNTER — Other Ambulatory Visit: Payer: Self-pay

## 2022-08-04 ENCOUNTER — Other Ambulatory Visit: Payer: Self-pay

## 2022-08-08 ENCOUNTER — Other Ambulatory Visit: Payer: Self-pay

## 2022-08-09 ENCOUNTER — Other Ambulatory Visit: Payer: Self-pay

## 2022-09-01 ENCOUNTER — Other Ambulatory Visit: Payer: Self-pay

## 2022-10-11 MED FILL — Valacyclovir HCl Tab 1 GM: ORAL | 90 days supply | Qty: 90 | Fill #2 | Status: AC

## 2022-10-26 ENCOUNTER — Ambulatory Visit: Payer: Commercial Managed Care - PPO | Admitting: Family

## 2022-12-01 ENCOUNTER — Encounter (INDEPENDENT_AMBULATORY_CARE_PROVIDER_SITE_OTHER): Payer: Self-pay

## 2022-12-13 ENCOUNTER — Other Ambulatory Visit: Payer: Self-pay | Admitting: Family

## 2022-12-13 DIAGNOSIS — Z1231 Encounter for screening mammogram for malignant neoplasm of breast: Secondary | ICD-10-CM

## 2022-12-16 ENCOUNTER — Ambulatory Visit
Admission: RE | Admit: 2022-12-16 | Discharge: 2022-12-16 | Disposition: A | Discharge status: Home / Self Care | Payer: Commercial Managed Care - PPO | Source: Ambulatory Visit

## 2022-12-16 DIAGNOSIS — Z1231 Encounter for screening mammogram for malignant neoplasm of breast: Secondary | ICD-10-CM

## 2022-12-27 ENCOUNTER — Other Ambulatory Visit: Payer: Self-pay

## 2022-12-27 ENCOUNTER — Ambulatory Visit: Payer: Commercial Managed Care - PPO | Admitting: Family

## 2022-12-27 ENCOUNTER — Encounter: Payer: Self-pay | Admitting: Family

## 2022-12-27 ENCOUNTER — Other Ambulatory Visit (HOSPITAL_COMMUNITY)
Admission: RE | Admit: 2022-12-27 | Discharge: 2022-12-27 | Disposition: A | Discharge status: Home / Self Care | Payer: Commercial Managed Care - PPO | Source: Ambulatory Visit | Attending: Family | Admitting: Family

## 2022-12-27 VITALS — BP 136/76 | HR 72 | Temp 98.4°F | Ht 62.0 in | Wt 166.6 lb

## 2022-12-27 DIAGNOSIS — Z136 Encounter for screening for cardiovascular disorders: Secondary | ICD-10-CM | POA: Diagnosis not present

## 2022-12-27 DIAGNOSIS — Z0000 Encounter for general adult medical examination without abnormal findings: Secondary | ICD-10-CM

## 2022-12-27 DIAGNOSIS — F32A Depression, unspecified: Secondary | ICD-10-CM

## 2022-12-27 DIAGNOSIS — Z13220 Encounter for screening for lipoid disorders: Secondary | ICD-10-CM

## 2022-12-27 DIAGNOSIS — R21 Rash and other nonspecific skin eruption: Secondary | ICD-10-CM | POA: Diagnosis not present

## 2022-12-27 DIAGNOSIS — F419 Anxiety disorder, unspecified: Secondary | ICD-10-CM

## 2022-12-27 DIAGNOSIS — E669 Obesity, unspecified: Secondary | ICD-10-CM | POA: Diagnosis not present

## 2022-12-27 DIAGNOSIS — Z6841 Body Mass Index (BMI) 40.0 and over, adult: Secondary | ICD-10-CM

## 2022-12-27 DIAGNOSIS — T783XXS Angioneurotic edema, sequela: Secondary | ICD-10-CM | POA: Diagnosis not present

## 2022-12-27 DIAGNOSIS — F418 Other specified anxiety disorders: Secondary | ICD-10-CM

## 2022-12-27 DIAGNOSIS — B009 Herpesviral infection, unspecified: Secondary | ICD-10-CM | POA: Diagnosis not present

## 2022-12-27 DIAGNOSIS — E538 Deficiency of other specified B group vitamins: Secondary | ICD-10-CM | POA: Diagnosis not present

## 2022-12-27 LAB — COMPREHENSIVE METABOLIC PANEL
ALT: 33 U/L (ref 0–35)
AST: 30 U/L (ref 0–37)
Albumin: 3.9 g/dL (ref 3.5–5.2)
Alkaline Phosphatase: 77 U/L (ref 39–117)
BUN: 8 mg/dL (ref 6–23)
CO2: 25 meq/L (ref 19–32)
Calcium: 9.4 mg/dL (ref 8.4–10.5)
Chloride: 103 meq/L (ref 96–112)
Creatinine, Ser: 0.79 mg/dL (ref 0.40–1.20)
GFR: 92.48 mL/min (ref >60.00)
Glucose, Bld: 74 mg/dL (ref 70–99)
Potassium: 3.5 meq/L (ref 3.5–5.1)
Sodium: 136 meq/L (ref 135–145)
Total Bilirubin: 0.4 mg/dL (ref 0.2–1.2)
Total Protein: 7.6 g/dL (ref 6.0–8.3)

## 2022-12-27 LAB — HEMOGLOBIN A1C: Hgb A1c MFr Bld: 5.3 % (ref 4.6–6.5)

## 2022-12-27 LAB — CBC WITH DIFFERENTIAL/PLATELET
Basophils Absolute: 0.1 10*3/uL (ref 0.0–0.1)
Basophils Relative: 0.8 % (ref 0.0–3.0)
Eosinophils Absolute: 0 10*3/uL (ref 0.0–0.7)
Eosinophils Relative: 0.5 % (ref 0.0–5.0)
HCT: 39.9 % (ref 36.0–46.0)
Hemoglobin: 12.8 g/dL (ref 12.0–15.0)
Lymphocytes Relative: 35.5 % (ref 12.0–46.0)
Lymphs Abs: 2.5 10*3/uL (ref 0.7–4.0)
MCHC: 32.1 g/dL (ref 30.0–36.0)
MCV: 89.7 fl (ref 78.0–100.0)
Monocytes Absolute: 0.3 10*3/uL (ref 0.1–1.0)
Monocytes Relative: 4.8 % (ref 3.0–12.0)
Neutro Abs: 4.2 10*3/uL (ref 1.4–7.7)
Neutrophils Relative %: 58.4 % (ref 43.0–77.0)
Platelets: 330 10*3/uL (ref 150.0–400.0)
RBC: 4.44 Mil/uL (ref 3.87–5.11)
RDW: 13.3 % (ref 11.5–15.5)
WBC: 7.1 10*3/uL (ref 4.0–10.5)

## 2022-12-27 LAB — LIPID PANEL
Cholesterol: 228 mg/dL — ABNORMAL HIGH (ref 0–200)
HDL: 65.3 mg/dL (ref >39.00)
LDL Cholesterol: 147 mg/dL — ABNORMAL HIGH (ref 0–99)
NonHDL: 162.74
Total CHOL/HDL Ratio: 3
Triglycerides: 78 mg/dL (ref 0.0–149.0)
VLDL: 15.6 mg/dL (ref 0.0–40.0)

## 2022-12-27 LAB — VITAMIN D 25 HYDROXY (VIT D DEFICIENCY, FRACTURES): VITD: 47.59 ng/mL (ref 30.00–100.00)

## 2022-12-27 LAB — TSH: TSH: 0.84 u[IU]/mL (ref 0.35–5.50)

## 2022-12-27 MED ORDER — CYANOCOBALAMIN 1000 MCG/ML IJ SOLN
1000.0000 ug | INTRAMUSCULAR | 15 refills | Status: DC
  Filled 2022-12-27: qty 1, 30d supply, fill #0
  Filled 2023-02-19: qty 1, 30d supply, fill #1
  Filled 2023-04-06: qty 1, 30d supply, fill #2

## 2022-12-27 MED ORDER — PROPRANOLOL HCL 10 MG PO TABS
10.0000 mg | ORAL_TABLET | Freq: Every day | ORAL | 3 refills | Status: DC | PRN
Start: 2022-12-27 — End: 2023-05-05
  Filled 2022-12-27: qty 90, 45d supply, fill #0
  Filled 2023-02-19: qty 90, 45d supply, fill #1

## 2022-12-27 MED ORDER — EPINEPHRINE 0.3 MG/0.3ML IJ SOAJ
0.3000 mg | INTRAMUSCULAR | 2 refills | Status: AC | PRN
  Filled 2022-12-27: qty 2, 30d supply, fill #0

## 2022-12-27 MED ORDER — "NEEDLE (DISP) 25G X 1"" MISC"
0 refills | Status: AC
  Filled 2022-12-27 – 2023-04-06 (×3): qty 16, fill #0

## 2022-12-27 MED ORDER — ETONOGESTREL-ETHINYL ESTRADIOL 0.12-0.015 MG/24HR VA RING
1.0000 | VAGINAL_RING | VAGINAL | 4 refills | Status: DC
  Filled 2022-12-27: qty 3, 84d supply, fill #0
  Filled 2023-04-06: qty 3, 84d supply, fill #1

## 2022-12-27 MED ORDER — TRIAMCINOLONE ACETONIDE 0.1 % EX OINT
1.0000 | TOPICAL_OINTMENT | Freq: Two times a day (BID) | CUTANEOUS | 2 refills | Status: AC | PRN
  Filled 2022-12-27: qty 80, 40d supply, fill #0

## 2022-12-27 MED ORDER — BUPROPION HCL ER (XL) 150 MG PO TB24
150.0000 mg | ORAL_TABLET | Freq: Every day | ORAL | 3 refills | Status: DC
  Filled 2022-12-27: qty 90, 90d supply, fill #0
  Filled 2023-02-19 – 2023-04-06 (×3): qty 90, 90d supply, fill #1

## 2022-12-27 MED ORDER — VALACYCLOVIR HCL 1 G PO TABS
1000.0000 mg | ORAL_TABLET | Freq: Every day | ORAL | 2 refills | Status: DC
  Filled 2022-12-27: qty 90, 90d supply, fill #0
  Filled 2023-04-06: qty 90, 90d supply, fill #1

## 2022-12-27 MED ORDER — SERTRALINE HCL 100 MG PO TABS
100.0000 mg | ORAL_TABLET | Freq: Every day | ORAL | 3 refills | Status: DC
  Filled 2022-12-27 – 2023-02-19 (×2): qty 90, 90d supply, fill #0

## 2022-12-27 NOTE — Progress Notes (Signed)
Assessment & Plan:  Routine physical examination Assessment & Plan: Clinical breast exam performed today, Pap smear obtained.  Encouraged continued exercise.  Referral for colonoscopy is placed  Orders: -     VITAMIN D 25 Hydroxy (Vit-D Deficiency, Fractures) -     Hemoglobin A1c -     CBC with Differential/Platelet -     Comprehensive metabolic panel -     Lipid panel -     TSH -     Ambulatory referral to Gastroenterology -     Etonogestrel-Ethinyl Estradiol; Place 1 ring vaginally every month. Patient needs annual exam appointment  Dispense: 3 each; Refill: 4 -     Cytology - PAP  BMI 40.0-44.9, adult (HCC) -     TSH -     buPROPion HCl ER (XL); Take 1 tablet (150 mg total) by mouth daily.  Dispense: 90 tablet; Refill: 3  B12 deficiency -     Cyanocobalamin; Inject 1 mL (1,000 mcg total) into the muscle every 30 (thirty) days.  Dispense: 1 mL; Refill: 15 -     Needle (Disp); Use to give IM b12 injection  Dispense: 16 each; Refill: 0  Obesity (BMI 30.0-34.9) -     Hemoglobin A1c -     TSH  Rash and other nonspecific skin eruption -     Triamcinolone Acetonide; Apply 1 Application topically 2 (two) times daily as needed for itch. Avoid applying to face, groin, and axilla. Use as directed. Use less 7 days.  Dispense: 80 g; Refill: 2  HSV infection -     valACYclovir HCl; Take 1 tablet (1,000 mg total) by mouth daily.  Dispense: 90 tablet; Refill: 2  Herpes -     valACYclovir HCl; Take 1 tablet (1,000 mg total) by mouth daily.  Dispense: 90 tablet; Refill: 2  Angioedema, sequela -     EPINEPHrine; Inject 0.3 mg into the muscle as needed for anaphylaxis.  Dispense: 2 each; Refill: 2  Situational anxiety Assessment & Plan: Trial of propranolol 20 mg daily as needed.  Orders: -     Propranolol HCl; Take 1-2 tablets (10-20 mg total) by mouth daily as needed (anxiety).  Dispense: 90 tablet; Refill: 3  Anxiety and depression Assessment & Plan: Chronic, stable.  Continue  Zoloft 100 mg daily, resume Wellbutrin 150 mg daily.  Orders: -     buPROPion HCl ER (XL); Take 1 tablet (150 mg total) by mouth daily.  Dispense: 90 tablet; Refill: 3 -     Sertraline HCl; Take 1 tablet (100 mg total) by mouth daily.  Dispense: 90 tablet; Refill: 3  Encounter for lipid screening for cardiovascular disease -     Lipid panel     Return precautions given.   Risks, benefits, and alternatives of the medications and treatment plan prescribed today were discussed, and patient expressed understanding.   Education regarding symptom management and diagnosis given to patient on AVS either electronically or printed.  No follow-ups on file.  Rennie Plowman, FNP  Subjective:    Patient ID: Angie Tucker, female    DOB: 07/25/1980, 42 y.o.   MRN: [id number]  CC: Angie Tucker is a 41 y.o. female who presents today for physical exam.    HPI: Situational anxiety at work prior to procedures.  She works in ICU  She feels depression is well-controlled.  Compliant with Zoloft 100 mg qd      She is no longer on Zepbound.  Prescribed Contrave March 2024 however she  had never started due to insurance.  Weaned off of Wellbutrin and now interested in wellbutrin for weight loss maintenance.  Denies history of anorexia, bulimia, seizures She request refills of triamcinolone, epinephrine, Valtrex  Colorectal Cancer Screening: mom had pancreatic cancer.  She would like to have colonoscopy done prior to the age of 8.  Previously she is seeing Dr. Servando Snare for diarrhea Breast Cancer Screening: Mammogram UTD Cervical Cancer Screening: due, obtain 11/11/2019, negative malignancy, negative HPV Bone Health screening/DEXA for 65+: No increased fracture risk. Defer screening at this time.        Tetanus - due         Exercise: Gets regular exercise, 3-4 times day..   Alcohol use: Occasionally Smoking/tobacco use: Nonsmoker.    Health Maintenance  Topic Date Due   DTaP/Tdap/Td  vaccine (1 - Tdap) Never done   Flu Shot  Never done   COVID-19 Vaccine (5 - 2023-24 season) 12/18/2022   Pap Smear  11/11/2022   Hepatitis C Screening  Completed   HIV Screening  Completed   HPV Vaccine  Aged Out    ALLERGIES: Fluogen [influenza virus vaccine], Food, and Amoxicillin  Current Outpatient Medications on File Prior to Visit  Medication Sig Dispense Refill   famotidine (PEPCID) 40 MG tablet Take 1 tablet (40 mg total) by mouth daily. 30 tablet 5   loratadine (CLARITIN) 10 MG tablet Take 1 tablet (10 mg total) by mouth 2 (two) times daily. 60 tablet 5   Syringe/Needle, Disp, (SYRINGE 3CC/25GX1") 25G X 1" 3 ML MISC Use to give IM B12 injection 16 each 0   [DISCONTINUED] misoprostol (CYTOTEC) 200 MCG tablet Place four tablets in between your gums and cheeks (two tablets on each side) as instructed OR insert four tablets vaginally, repeat every 6-hrs as needed (Patient not taking: Reported on 04/13/2020) 12 tablet 0   No current facility-administered medications on file prior to visit.    Review of Systems  Constitutional:  Negative for chills, fever and unexpected weight change.  HENT:  Negative for congestion.   Respiratory:  Negative for cough.   Cardiovascular:  Negative for chest pain, palpitations and leg swelling.  Gastrointestinal:  Negative for nausea and vomiting.  Musculoskeletal:  Negative for arthralgias and myalgias.  Skin:  Negative for rash.  Neurological:  Negative for headaches.  Hematological:  Negative for adenopathy.  Psychiatric/Behavioral:  Negative for confusion. The patient is nervous/anxious.       Objective:    BP 136/76   Pulse 72   Temp 98.4 F (36.9 C) (Oral)   Ht 5\' 2"  (1.575 m)   Wt 166 lb 9.6 oz (75.6 kg)   LMP  (LMP Unknown)   SpO2 98%   BMI 30.47 kg/m   BP Readings from Last 3 Encounters:  12/27/22 136/76  05/27/22 (!) 140/82  04/19/22 128/80   Wt Readings from Last 3 Encounters:  12/27/22 166 lb 9.6 oz (75.6 kg)   06/24/22 217 lb 6.4 oz (98.6 kg)  05/27/22 217 lb 6.4 oz (98.6 kg)      12/27/2022   11:09 AM 06/24/2022    1:07 PM 04/19/2022    8:31 AM  Depression screen PHQ 2/9  Decreased Interest 0 0 0  Down, Depressed, Hopeless 1 0 0  PHQ - 2 Score 1 0 0  Altered sleeping 2    Tired, decreased energy 0    Change in appetite 0    Feeling bad or failure about yourself  0  Trouble concentrating 0    Moving slowly or fidgety/restless 0    Suicidal thoughts 0    PHQ-9 Score 3    Difficult doing work/chores Somewhat difficult      Physical Exam Vitals reviewed.  Constitutional:      Appearance: Normal appearance. She is well-developed.  Eyes:     Conjunctiva/sclera: Conjunctivae normal.  Neck:     Thyroid: No thyroid mass or thyromegaly.  Cardiovascular:     Rate and Rhythm: Normal rate and regular rhythm.     Pulses: Normal pulses.     Heart sounds: Normal heart sounds.  Pulmonary:     Effort: Pulmonary effort is normal.     Breath sounds: Normal breath sounds. No wheezing, rhonchi or rales.  Chest:  Breasts:    Breasts are symmetrical.     Right: No inverted nipple, mass, nipple discharge, skin change or tenderness.     Left: No inverted nipple, mass, nipple discharge, skin change or tenderness.  Abdominal:     General: Bowel sounds are normal. There is no distension.     Palpations: Abdomen is soft. Abdomen is not rigid. There is no fluid wave or mass.     Tenderness: There is no abdominal tenderness. There is no guarding or rebound.  Genitourinary:    Cervix: No cervical motion tenderness, discharge or friability.     Uterus: Not enlarged, not fixed and not tender.      Adnexa:        Right: No mass, tenderness or fullness.         Left: No mass, tenderness or fullness.       Comments: Pap performed. No CMT. Unable to appreciated ovaries. Lymphadenopathy:     Head:     Right side of head: No submental, submandibular, tonsillar, preauricular, posterior auricular or occipital  adenopathy.     Left side of head: No submental, submandibular, tonsillar, preauricular, posterior auricular or occipital adenopathy.     Cervical:     Right cervical: No superficial, deep or posterior cervical adenopathy.    Left cervical: No superficial, deep or posterior cervical adenopathy.     Upper Body:     Right upper body: No pectoral adenopathy.     Left upper body: No pectoral adenopathy.  Skin:    General: Skin is warm and dry.  Neurological:     Mental Status: She is alert.  Psychiatric:        Speech: Speech normal.        Behavior: Behavior normal.        Thought Content: Thought content normal.

## 2022-12-27 NOTE — Assessment & Plan Note (Signed)
Clinical breast exam performed today, Pap smear obtained.  Encouraged continued exercise.  Referral for colonoscopy is placed

## 2022-12-27 NOTE — Assessment & Plan Note (Signed)
Chronic, stable.  Continue Zoloft 100 mg daily, resume Wellbutrin 150 mg daily.

## 2022-12-27 NOTE — Assessment & Plan Note (Signed)
Trial of propranolol 20 mg daily as needed.

## 2022-12-27 NOTE — Patient Instructions (Signed)
Resume Wellbutrin 150 mg daily Trial of propranolol 10 to 20 mg daily as needed for anxiety.    Referral to gastroenterology Let us know if you dont hear back within a week in regards to an appointment being scheduled.   So that you are aware, if you are [facility] MyChart user , please pay attention to your MyChart messages as you may receive a MyChart message with a phone number to call and schedule this test/appointment own your own from our referral coordinator. This is a new process so I do not want you to miss this message.  If you are not a MyChart user, you will receive a phone call.   Health Maintenance, Female Adopting a healthy lifestyle and getting preventive care are important in promoting health and wellness. Ask your health care provider about: The right schedule for you to have regular tests and exams. Things you can do on your own to prevent diseases and keep yourself healthy. What should I know about diet, weight, and exercise? Eat a healthy diet  Eat a diet that includes plenty of vegetables, fruits, low-fat dairy products, and lean protein. Do not eat a lot of foods that are high in solid fats, added sugars, or sodium. Maintain a healthy weight Body mass index (BMI) is used to identify weight problems. It estimates body fat based on height and weight. Your health care provider can help determine your BMI and help you achieve or maintain a healthy weight. Get regular exercise Get regular exercise. This is one of the most important things you can do for your health. Most adults should: Exercise for at least 150 minutes each week. The exercise should increase your heart rate and make you sweat (moderate-intensity exercise). Do strengthening exercises at least twice a week. This is in addition to the moderate-intensity exercise. Spend less time sitting. Even light physical activity can be beneficial. Watch cholesterol and blood lipids Have your blood tested for lipids and  cholesterol at 42 years of age, then have this test every 5 years. Have your cholesterol levels checked more often if: Your lipid or cholesterol levels are high. You are older than 42 years of age. You are at high risk for heart disease. What should I know about cancer screening? Depending on your health history and family history, you may need to have cancer screening at various ages. This may include screening for: Breast cancer. Cervical cancer. Colorectal cancer. Skin cancer. Lung cancer. What should I know about heart disease, diabetes, and high blood pressure? Blood pressure and heart disease High blood pressure causes heart disease and increases the risk of stroke. This is more likely to develop in people who have high blood pressure readings or are overweight. Have your blood pressure checked: Every 3-5 years if you are 48-35 years of age. Every year if you are 63 years old or older. Diabetes Have regular diabetes screenings. This checks your fasting blood sugar level. Have the screening done: Once every three years after age 26 if you are at a normal weight and have a low risk for diabetes. More often and at a younger age if you are overweight or have a high risk for diabetes. What should I know about preventing infection? Hepatitis B If you have a higher risk for hepatitis B, you should be screened for this virus. Talk with your health care provider to find out if you are at risk for hepatitis B infection. Hepatitis C Testing is recommended for: Everyone born from [birth year]  through 1965. Anyone with known risk factors for hepatitis C. Sexually transmitted infections (STIs) Get screened for STIs, including gonorrhea and chlamydia, if: You are sexually active and are younger than 42 years of age. You are older than 42 years of age and your health care provider tells you that you are at risk for this type of infection. Your sexual activity has changed since you were last screened,  and you are at increased risk for chlamydia or gonorrhea. Ask your health care provider if you are at risk. Ask your health care provider about whether you are at high risk for HIV. Your health care provider may recommend a prescription medicine to help prevent HIV infection. If you choose to take medicine to prevent HIV, you should first get tested for HIV. You should then be tested every 3 months for as long as you are taking the medicine. Pregnancy If you are about to stop having your period (premenopausal) and you may become pregnant, seek counseling before you get pregnant. Take 400 to 800 micrograms (mcg) of folic acid every day if you become pregnant. Ask for birth control (contraception) if you want to prevent pregnancy. Osteoporosis and menopause Osteoporosis is a disease in which the bones lose minerals and strength with aging. This can result in bone fractures. If you are 40 years old or older, or if you are at risk for osteoporosis and fractures, ask your health care provider if you should: Be screened for bone loss. Take a calcium or vitamin D supplement to lower your risk of fractures. Be given hormone replacement therapy (HRT) to treat symptoms of menopause. Follow these instructions at home: Alcohol use Do not drink alcohol if: Your health care provider tells you not to drink. You are pregnant, may be pregnant, or are planning to become pregnant. If you drink alcohol: Limit how much you have to: 0-1 drink a day. Know how much alcohol is in your drink. In the [place] one drink equals one 12 oz bottle of beer (355 mL), one 5 oz glass of wine (148 mL), or one 1 oz glass of hard liquor (44 mL). Lifestyle Do not use any products that contain nicotine or tobacco. These products include cigarettes, chewing tobacco, and vaping devices, such as e-cigarettes. If you need help quitting, ask your health care provider. Do not use street drugs. Do not share needles. Ask your health care  provider for help if you need support or information about quitting drugs. General instructions Schedule regular health, dental, and eye exams. Stay current with your vaccines. Tell your health care provider if: You often feel depressed. You have ever been abused or do not feel safe at home. Summary Adopting a healthy lifestyle and getting preventive care are important in promoting health and wellness. Follow your health care provider's instructions about healthy diet, exercising, and getting tested or screened for diseases. Follow your health care provider's instructions on monitoring your cholesterol and blood pressure. This information is not intended to replace advice given to you by your health care provider. Make sure you discuss any questions you have with your health care provider. Document Revised: 08/24/2020 Document Reviewed: 08/24/2020 Elsevier Patient Education  2024 [employer].

## 2022-12-30 LAB — CYTOLOGY - PAP
Comment: NEGATIVE
Diagnosis: NEGATIVE
High risk HPV: POSITIVE — AB

## 2023-01-05 ENCOUNTER — Encounter: Payer: Self-pay | Admitting: Family

## 2023-01-05 NOTE — Telephone Encounter (Signed)
Fyi.

## 2023-01-05 NOTE — Telephone Encounter (Signed)
-----   Message from Rennie Plowman sent at 01/05/2023  2:17 PM EDT ----- Review result note with patient Please let me know if she needs a GYN referral or if she plans to see previous GYN Dr Billy Coast

## 2023-01-06 NOTE — Telephone Encounter (Signed)
noted 

## 2023-01-18 ENCOUNTER — Encounter: Payer: Self-pay | Admitting: *Deleted

## 2023-01-28 ENCOUNTER — Encounter: Payer: Self-pay | Admitting: Family

## 2023-02-20 ENCOUNTER — Other Ambulatory Visit: Payer: Self-pay

## 2023-02-20 NOTE — Progress Notes (Unsigned)
Referring Provider:  ***  [phone] F here for WWE, but in preparing for pap, found she needed colposcopy last Nov 2020, is ok with performing procedure today and delaying WWE. <br> <br>Indications:<br>Pap smear Nov 2020 showed: ASCUS, HPV-16 POS. Prior pap(s): no records avail, but pt reports abnormal in 1999-ish. <br>LMP:<br>Contraception: <br>Number current sexual partners: <br>Number of partners in lifetime: <br>High risk partner: <br>History of STD: <br>Future fertility desired: <br>Prior cervical/vaginal findings: pt reports colposcopy in "1999-ish" was normal<br>Prior cervical treatment: (Leep / cryo)<br><br>Symptoms/History:<br>Abnormal vaginal discharge: <br>HIV: <br>Pregnant:<br>Postmenopausal:<br>DES Exposure: <br>Intermenstrual bleeding: <br>Postcoital bleeding: <br>Immunosuppressed: <br>Bleeding Problems (non-gynecological): <br>Smoking: <br>NSAID current use: <br>Other serious chronic illnesses:  HPI:  Angie Tucker is a 42 y.o.  [id number]  who presents today for evaluation and management of abnormal cervical cytology.    Prior pap smears: *** Date:12/27/22 [id number]:[id number] HPV: Positive  Date:11/11/19 NWG:NFAO  Prior cervical / vaginal findings: *** Date:*** Results: ***  Prior cervical treatment(s): *** Date:*** Results: ***  Symptoms/History:  -Abnormal vaginal discharge: *** -Postmenopausal: *** -Intermenstrual bleeding: *** -Postcoital bleeding: *** -Bleeding problems (non-gyn): *** -Contraception: *** -Number of current sexual partners: *** -Number of partners in lifetime: *** -History of a high risk partner: *** -History of STDs: *** -Smoking: ***      ROS:  {Ros - complete:30496}  OB History  Gravida Para Term Preterm AB Living  4 1 1  0 2 1  SAB IAB Ectopic Multiple Live Births  1 0 0   1    # Outcome Date GA Lbr Len/2nd Weight Sex Type Anes PTL Lv  4 Gravida           3 Term 02/21/21 [redacted]w[redacted]d  7 lb 14.6 oz (3.59 kg) F CS-LTranv Spinal  LIV  2 AB 03/26/20 [redacted]w[redacted]d          1 SAB             Past Medical History:  Diagnosis Date   Anxiety    BRCA negative 03/2018   MyRisk neg   Broken wrist Right Wrist   Depression    Depression    Eczema    Family history of BRCA gene positive    PGM with ovarian cancer   Family history of breast cancer    Family history of ovarian cancer    Family history of pancreatic cancer    Herpes    Pregnancy induced hypertension     Past Surgical History:  Procedure Laterality Date   CESAREAN SECTION N/A 02/21/2021   Procedure: Primary CESAREAN SECTION;  Surgeon: Olivia Mackie, MD;  Location: [facility] LD ORS;  Service: Obstetrics;  Laterality: N/A;  EDD: 03/03/21   FRACTURE SURGERY     WRIST SURGERY  2011   WRIST SURGERY Left     SOCIAL HISTORY:  Social History   Substance and Sexual Activity  Alcohol Use Not Currently   Comment: occasionally    Social History   Substance and Sexual Activity  Drug Use Never     Family History  Problem Relation Age of Onset   Hyperlipidemia Mother    Hypertension Mother    Cancer Mother        pancreatic and breast   Breast cancer Mother 43   Pancreatic cancer Mother 32   Depression Mother    Anxiety disorder Mother    Alcohol abuse Mother    Multiple myeloma Maternal Grandmother 29   Ovarian cancer Paternal Grandmother 28   Uterine cancer Paternal Grandmother 69       BRCA positive  Breast cancer Paternal Grandmother 49   Kidney cancer Paternal Grandfather 66   Pancreatic cancer Paternal Great-grandmother    Pancreatic cancer Other    Thyroid cancer Neg Hx    Multiple endocrine neoplasia Neg Hx    CAD Neg Hx     ALLERGIES:  Fluogen [influenza virus vaccine], Food, and Amoxicillin  She has a current medication list which includes the following prescription(s): bupropion, cyanocobalamin, epinephrine, etonogestrel-ethinyl estradiol, famotidine, loratadine, needle (disp) 25 g, propranolol, sertraline, syringe 3cc/25gx1", triamcinolone ointment, valacyclovir,  and [DISCONTINUED] misoprostol.  Physical Exam: -Vitals:  There were no vitals taken for this visit.  PROCEDURE: Colposcopy performed with 4% acetic acid after verbal consent obtained                           -Aceto-white Lesions Location(s): See above              -Biopsy performed at *** o'clock               -ECC indicated and performed: {yes no:314532}     -Biopsy sites made hemostatic with pressure and Monsel's solution   -Satisfactory colposcopy: {yes no:314532}    -Evidence of Invasive cervical CA :  NO  ASSESSMENT:  Angie Tucker is a 42 y.o. [id number] with LSIL***ASCUS and HPV-HR positive*** 16/18/45 POS***NEG on recent pap (DATE), here for colposcopy today, performed as above without complications.  -ECC and 3 cervical bx sent to pathology -Aftercare instructions for home reviewed, si/sx of when to call/return discussed. -RTC 2 weeks for results, sooner prn  No orders of the defined types were placed in this encounter.          F/U  No follow-ups on file.   Julieanne Manson, DO [facility]

## 2023-02-21 ENCOUNTER — Ambulatory Visit: Payer: Commercial Managed Care - PPO | Admitting: Obstetrics

## 2023-02-21 ENCOUNTER — Encounter: Payer: Self-pay | Admitting: Obstetrics

## 2023-02-21 ENCOUNTER — Other Ambulatory Visit (HOSPITAL_COMMUNITY)
Admission: RE | Admit: 2023-02-21 | Discharge: 2023-02-21 | Disposition: A | Discharge status: Home / Self Care | Payer: Commercial Managed Care - PPO | Source: Ambulatory Visit | Attending: Obstetrics | Admitting: Obstetrics

## 2023-02-21 VITALS — BP 142/92 | HR 63 | Ht 62.0 in | Wt 158.0 lb

## 2023-02-21 DIAGNOSIS — Z23 Encounter for immunization: Secondary | ICD-10-CM

## 2023-02-21 DIAGNOSIS — R87810 Cervical high risk human papillomavirus (HPV) DNA test positive: Secondary | ICD-10-CM

## 2023-02-21 DIAGNOSIS — Z202 Contact with and (suspected) exposure to infections with a predominantly sexual mode of transmission: Secondary | ICD-10-CM

## 2023-02-22 LAB — CERVICOVAGINAL ANCILLARY ONLY
Chlamydia: NEGATIVE
Comment: NEGATIVE
Comment: NEGATIVE
Comment: NORMAL
Neisseria Gonorrhea: NEGATIVE
Trichomonas: NEGATIVE

## 2023-02-22 LAB — HEP, RPR, HIV PANEL
HIV Screen 4th Generation wRfx: NONREACTIVE
Hepatitis B Surface Ag: NEGATIVE
RPR Ser Ql: NONREACTIVE

## 2023-02-24 ENCOUNTER — Other Ambulatory Visit: Payer: Self-pay

## 2023-02-24 ENCOUNTER — Ambulatory Visit: Payer: Commercial Managed Care - PPO | Admitting: Obstetrics and Gynecology

## 2023-04-06 ENCOUNTER — Other Ambulatory Visit: Payer: Self-pay

## 2023-04-17 ENCOUNTER — Other Ambulatory Visit: Payer: Self-pay

## 2023-04-23 ENCOUNTER — Encounter: Payer: Self-pay | Admitting: Family

## 2023-04-24 ENCOUNTER — Ambulatory Visit: Payer: Commercial Managed Care - PPO

## 2023-04-24 DIAGNOSIS — Z23 Encounter for immunization: Secondary | ICD-10-CM

## 2023-04-24 NOTE — Telephone Encounter (Signed)
 Noted.

## 2023-04-26 ENCOUNTER — Encounter: Payer: Self-pay | Admitting: *Deleted

## 2023-04-28 ENCOUNTER — Ambulatory Visit: Payer: BC Managed Care – PPO

## 2023-05-05 ENCOUNTER — Ambulatory Visit (INDEPENDENT_AMBULATORY_CARE_PROVIDER_SITE_OTHER): Payer: BC Managed Care – PPO | Admitting: Family

## 2023-05-05 ENCOUNTER — Encounter: Payer: Self-pay | Admitting: Family

## 2023-05-05 ENCOUNTER — Telehealth: Payer: Self-pay | Admitting: Family

## 2023-05-05 VITALS — BP 130/76 | HR 76 | Temp 98.2°F | Ht 62.0 in | Wt 174.4 lb

## 2023-05-05 DIAGNOSIS — F419 Anxiety disorder, unspecified: Secondary | ICD-10-CM | POA: Diagnosis not present

## 2023-05-05 DIAGNOSIS — E66811 Obesity, class 1: Secondary | ICD-10-CM | POA: Diagnosis not present

## 2023-05-05 DIAGNOSIS — F32A Depression, unspecified: Secondary | ICD-10-CM

## 2023-05-05 DIAGNOSIS — F418 Other specified anxiety disorders: Secondary | ICD-10-CM

## 2023-05-05 DIAGNOSIS — Z9189 Other specified personal risk factors, not elsewhere classified: Secondary | ICD-10-CM

## 2023-05-05 MED ORDER — TIRZEPATIDE-WEIGHT MANAGEMENT 2.5 MG/0.5ML ~~LOC~~ SOLN
2.5000 mg | SUBCUTANEOUS | 2 refills | Status: DC
Start: 2023-05-05 — End: 2023-05-17

## 2023-05-05 MED ORDER — PROPRANOLOL HCL 20 MG PO TABS: 20.0000 mg | ORAL_TABLET | Freq: Every day | ORAL | 1 refills | Status: DC | PRN

## 2023-05-05 NOTE — Progress Notes (Signed)
Assessment & Plan:  Increased risk of breast cancer Assessment & Plan: Discussed with patient to continue annual MRI, mammogram.  Patient due for MRI breast Feburary/2025.  Order has been placed.   Orders: -     MR BREAST BILATERAL W CONTRAST; Future  Situational anxiety Assessment & Plan: Well-controlled with propranolol 20 mg daily as needed  Orders: -     Propranolol HCl; Take 1 tablet (20 mg total) by mouth daily as needed (anxiety).  Dispense: 90 tablet; Refill: 1  Obesity (BMI 30.0-34.9) Assessment & Plan: Patient desires to resume Zepbound.  Start Zepbound 2.5 mg once weekly.  Counseled on blackbox warning as a relates to multiple endocrine neoplasia, medullary thyroid cancer, mechanism of action  Orders: -     Tirzepatide-Weight Management; Inject 2.5 mg into the skin once a week.  Dispense: 2 mL; Refill: 2  Anxiety and depression Assessment & Plan: Chronic, stable.  Continue Zoloft 100 mg daily, Wellbutrin 150 mg daily.      Return precautions given.   Risks, benefits, and alternatives of the medications and treatment plan prescribed today were discussed, and patient expressed understanding.   Education regarding symptom management and diagnosis given to patient on AVS either electronically or printed.  No follow-ups on file.  Rennie Plowman, FNP  Subjective:    Patient ID: Angie Tucker, female    DOB: [birth year], 43 y.o.   MRN: [id number]  CC: Angie Tucker is a 43 y.o. female who presents today for follow up.   HPI: Feels well today.  No new complaints.  She is interested in resuming Zepbound.  Previously stopped spring of last year due to to insurance. She has new insurance which will cover.  Denies family or personal history of medullary thyroid cancer, multiple endocrine neoplasia.  She previously tolerated Zepbound well without nausea or constipation  Propranolol 20 mg daily as needed is working well for situational  anxiety    Allergies: Fluogen [influenza virus vaccine], Food, and Amoxicillin Current Outpatient Medications on File Prior to Visit  Medication Sig Dispense Refill   buPROPion (WELLBUTRIN XL) 150 MG 24 hr tablet Take 1 tablet (150 mg total) by mouth daily. 90 tablet 3   cyanocobalamin (VITAMIN B12) 1000 MCG/ML injection Inject 1 mL (1,000 mcg total) into the muscle every 30 (thirty) days. 1 mL 15   EPINEPHrine 0.3 mg/0.3 mL IJ SOAJ injection Inject 0.3 mg into the muscle as needed for anaphylaxis. 2 each 2   etonogestrel-ethinyl estradiol (NUVARING) 0.12-0.015 MG/24HR vaginal ring Place 1 ring vaginally every month. Patient needs annual exam appointment 3 each 4   famotidine (PEPCID) 40 MG tablet Take 1 tablet (40 mg total) by mouth daily. 30 tablet 5   loratadine (CLARITIN) 10 MG tablet Take 1 tablet (10 mg total) by mouth 2 (two) times daily. 60 tablet 5   NEEDLE, DISP, 25 G 25G X 1" MISC Use to give IM b12 injection 16 each 0   sertraline (ZOLOFT) 100 MG tablet Take 1 tablet (100 mg total) by mouth daily. 90 tablet 3   Syringe/Needle, Disp, (SYRINGE 3CC/25GX1") 25G X 1" 3 ML MISC Use to give IM B12 injection 16 each 0   triamcinolone ointment (KENALOG) 0.1 % Apply 1 Application topically 2 (two) times daily as needed for itch. Avoid applying to face, groin, and axilla. Use as directed. Use less 7 days. 80 g 2   valACYclovir (VALTREX) 1000 MG tablet Take 1 tablet (1,000 mg total) by mouth daily. 90 tablet 2   [  DISCONTINUED] misoprostol (CYTOTEC) 200 MCG tablet Place four tablets in between your gums and cheeks (two tablets on each side) as instructed OR insert four tablets vaginally, repeat every 6-hrs as needed (Patient not taking: Reported on 04/13/2020) 12 tablet 0   No current facility-administered medications on file prior to visit.    Review of Systems  Constitutional:  Negative for chills and fever.  Respiratory:  Negative for cough.   Cardiovascular:  Negative for chest pain and  palpitations.  Gastrointestinal:  Negative for nausea and vomiting.      Objective:    BP 130/76   Pulse 76   Temp 98.2 F (36.8 C) (Oral)   Ht 5\' 2"  (1.575 m)   Wt 174 lb 6.4 oz (79.1 kg)   LMP  (LMP Unknown)   SpO2 99%   BMI 31.90 kg/m  BP Readings from Last 3 Encounters:  05/05/23 130/76  02/21/23 (!) 142/92  12/27/22 136/76   Wt Readings from Last 3 Encounters:  05/05/23 174 lb 6.4 oz (79.1 kg)  02/21/23 158 lb (71.7 kg)  12/27/22 166 lb 9.6 oz (75.6 kg)    Physical Exam Vitals reviewed.  Constitutional:      Appearance: She is well-developed.  Eyes:     Conjunctiva/sclera: Conjunctivae normal.  Cardiovascular:     Rate and Rhythm: Normal rate and regular rhythm.     Pulses: Normal pulses.     Heart sounds: Normal heart sounds.  Pulmonary:     Effort: Pulmonary effort is normal.     Breath sounds: Normal breath sounds. No wheezing, rhonchi or rales.  Skin:    General: Skin is warm and dry.  Neurological:     Mental Status: She is alert.  Psychiatric:        Speech: Speech normal.        Behavior: Behavior normal.        Thought Content: Thought content normal.

## 2023-05-05 NOTE — Patient Instructions (Signed)
MRI breast Due February  Let me know if you get a phone call in the next 2 weeks

## 2023-05-05 NOTE — Assessment & Plan Note (Signed)
Well-controlled with propranolol 20 mg daily as needed

## 2023-05-05 NOTE — Telephone Encounter (Signed)
PATIENT IS NEEDING A MRI DONE WITH AND WITHOUT CONTRAST MALIA FROM THE MRI DEPARTMENT NEEDS TO MAKE THE ORDER FOR THE MRI WAS SENT OVER CORRECTLY WITH AND WITH CONTRAST SHE WOULD LIKE A CALL BACK

## 2023-05-05 NOTE — Assessment & Plan Note (Addendum)
Discussed with patient to continue annual MRI, mammogram.  Patient due for MRI breast Feburary/2025.  Order has been placed.

## 2023-05-05 NOTE — Assessment & Plan Note (Signed)
Chronic, stable.  Continue Zoloft 100 mg daily, Wellbutrin 150 mg daily.

## 2023-05-05 NOTE — Assessment & Plan Note (Signed)
Patient desires to resume Zepbound.  Start Zepbound 2.5 mg once weekly.  Counseled on blackbox warning as a relates to multiple endocrine neoplasia, medullary thyroid cancer, mechanism of action

## 2023-05-08 ENCOUNTER — Ambulatory Visit (INDEPENDENT_AMBULATORY_CARE_PROVIDER_SITE_OTHER): Payer: BC Managed Care – PPO | Admitting: Dermatology

## 2023-05-08 ENCOUNTER — Encounter: Payer: Self-pay | Admitting: Dermatology

## 2023-05-08 DIAGNOSIS — L2084 Intrinsic (allergic) eczema: Secondary | ICD-10-CM

## 2023-05-08 DIAGNOSIS — Z7189 Other specified counseling: Secondary | ICD-10-CM | POA: Diagnosis not present

## 2023-05-08 MED ORDER — PIMECROLIMUS 1 % EX CREA: TOPICAL_CREAM | Freq: Two times a day (BID) | CUTANEOUS | 5 refills | Status: AC

## 2023-05-08 MED ORDER — HYDROCORTISONE 2.5 % EX CREA
TOPICAL_CREAM | Freq: Two times a day (BID) | CUTANEOUS | 2 refills | Status: AC | PRN
Start: 2023-05-08 — End: ?

## 2023-05-08 NOTE — Progress Notes (Signed)
   Follow-Up Visit   Subjective  Angie Tucker is a 43 y.o. female who presents for the following: eczema at face, itchy and dry. Patient has TMC 0.1% ointment to use at body which is keeping it controlled but is only using Vaseline at face. Flares every other month. We saw patient 2 years ago but did not prescribe anything to use at face. Areas at face have been present for about 4 weeks.   The patient has spots, moles and lesions to be evaluated, some may be new or changing and the patient may have concern these could be cancer.   The following portions of the chart were reviewed this encounter and updated as appropriate: medications, allergies, medical history  Review of Systems:  No other skin or systemic complaints except as noted in HPI or Assessment and Plan.  Objective  Well appearing patient in no apparent distress; mood and affect are within normal limits.   A focused examination was performed of the following areas: face  Relevant exam findings are noted in the Assessment and Plan.  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 It is often associated with allergic rhinitis and/or asthma and can require treatment with topical medications, phototherapy, or in severe cases biologic injectable medication (Dupixent; Adbry) or Oral JAK inhibitors.  Start HC 2.5% cr twice daily until smooth. Can cause acne periorificial dermatitis on face Start Elidel twice daily to face as needed. Use elidel if covered and affordable. Otherwise use HC. If elidel is later approved, use it instead  Topical steroids (such as triamcinolone, fluocinolone, fluocinonide,  mometasone, clobetasol, halobetasol, betamethasone, hydrocortisone) can cause thinning and lightening of the skin if they are used for too long in the same area. Your physician has selected the right strength medicine for your problem and area affected on the body. Please use your medication only as directed by your physician to prevent side effects.   Patient advised color will come back at face.   Discussed Dupixent. Patient will pursue if worsening  Dupilumab (Dupixent) is a treatment given by injection for adults and children with moderate-to-severe atopic dermatitis. Goal is control of skin condition, not cure. It is given as 2 injections at the first dose followed by 1 injection ever 2 weeks thereafter.  Young children are dosed monthly.  Potential side effects include allergic reaction, herpes infections, injection site reactions and conjunctivitis (inflammation of the eyes).  The use of Dupixent requires long term medication management, including periodic office visits.  Related Medications hydrocortisone 2.5 % cream Apply topically 2 (two) times daily as needed (Rash). pimecrolimus (ELIDEL) 1 % cream Apply topically 2 (two) times daily. COUNSELING AND COORDINATION OF CARE    Return in about 1 month (around 06/08/2023) for Eczema.  Anise Salvo, RMA, am acting as scribe for Elie Goody, MD .   Documentation: I have reviewed the above documentation for accuracy and completeness, and I agree with the above.  Elie Goody, MD

## 2023-05-08 NOTE — Telephone Encounter (Signed)
Spoke with Clifton Custard at the MRI department and he said that should be correct but he will get Laurena Spies to look at it when she gets off the phone.

## 2023-05-08 NOTE — Patient Instructions (Signed)

## 2023-05-08 NOTE — Telephone Encounter (Signed)
Return call to MRI department  New order with and without contrast is placed

## 2023-05-09 ENCOUNTER — Telehealth: Payer: Self-pay | Admitting: Family

## 2023-05-09 NOTE — Telephone Encounter (Signed)
Lft pt vm to call ofc to verify ins.thanks 

## 2023-05-11 ENCOUNTER — Ambulatory Visit: Payer: BC Managed Care – PPO | Admitting: Family

## 2023-05-17 ENCOUNTER — Other Ambulatory Visit: Payer: Self-pay

## 2023-05-17 ENCOUNTER — Encounter: Payer: Self-pay | Admitting: Family

## 2023-05-17 DIAGNOSIS — E66811 Obesity, class 1: Secondary | ICD-10-CM

## 2023-05-17 MED ORDER — TIRZEPATIDE-WEIGHT MANAGEMENT 2.5 MG/0.5ML ~~LOC~~ SOLN: 2.5000 mg | SUBCUTANEOUS | 2 refills | Status: DC

## 2023-05-18 ENCOUNTER — Other Ambulatory Visit (HOSPITAL_COMMUNITY): Payer: Self-pay

## 2023-05-18 ENCOUNTER — Telehealth: Payer: Self-pay | Admitting: Pharmacist

## 2023-05-18 NOTE — Telephone Encounter (Signed)
Pharmacy Patient Advocate Encounter   Received notification from CoverMyMeds that prior authorization for Zepbound 2.5MG /0.5ML pen-injectors is required/requested.   Insurance verification completed.   The patient is insured through [facility] .   Per test claim: PA required; PA submitted to above mentioned insurance via CoverMyMeds Key/confirmation #/EOC [id number] Status is pending

## 2023-05-18 NOTE — Telephone Encounter (Signed)
Pharmacy Patient Advocate Encounter  Received notification from [facility] that Prior Authorization for Zepbound 2.5MG /0.5ML pen-injectors has been APPROVED from 05/18/2023 to 01/16/2024   PA #/Case ID/Reference #:  [id number]

## 2023-06-05 ENCOUNTER — Ambulatory Visit: Admission: RE | Admit: 2023-06-05 | Payer: BC Managed Care – PPO | Source: Ambulatory Visit

## 2023-06-07 ENCOUNTER — Encounter: Payer: Self-pay | Admitting: Family

## 2023-06-07 ENCOUNTER — Ambulatory Visit: Admission: RE | Admit: 2023-06-07 | Payer: BC Managed Care – PPO | Source: Ambulatory Visit

## 2023-06-08 ENCOUNTER — Other Ambulatory Visit: Payer: Self-pay | Admitting: Family

## 2023-06-08 ENCOUNTER — Other Ambulatory Visit: Payer: Self-pay

## 2023-06-08 DIAGNOSIS — E66811 Obesity, class 1: Secondary | ICD-10-CM

## 2023-06-08 MED ORDER — ZEPBOUND 5 MG/0.5ML ~~LOC~~ SOAJ: 5.0000 mg | SUBCUTANEOUS | 2 refills | Status: DC

## 2023-06-08 MED ORDER — ZEPBOUND 5 MG/0.5ML ~~LOC~~ SOAJ
5.0000 mg | SUBCUTANEOUS | 2 refills | Status: DC
  Filled 2023-06-08: qty 2, 28d supply, fill #0

## 2023-06-13 ENCOUNTER — Ambulatory Visit: Payer: BC Managed Care – PPO | Admitting: Dermatology

## 2023-07-09 ENCOUNTER — Encounter: Payer: Self-pay | Admitting: Family

## 2023-07-10 ENCOUNTER — Other Ambulatory Visit: Payer: Self-pay

## 2023-07-10 DIAGNOSIS — Z0000 Encounter for general adult medical examination without abnormal findings: Secondary | ICD-10-CM

## 2023-07-10 DIAGNOSIS — E538 Deficiency of other specified B group vitamins: Secondary | ICD-10-CM

## 2023-07-10 DIAGNOSIS — Z6841 Body Mass Index (BMI) 40.0 and over, adult: Secondary | ICD-10-CM

## 2023-07-10 DIAGNOSIS — F32A Depression, unspecified: Secondary | ICD-10-CM

## 2023-07-10 MED ORDER — ETONOGESTREL-ETHINYL ESTRADIOL 0.12-0.015 MG/24HR VA RING: 1.0000 | VAGINAL_RING | VAGINAL | 4 refills | Status: DC

## 2023-07-10 MED ORDER — BUPROPION HCL ER (XL) 150 MG PO TB24: 150.0000 mg | ORAL_TABLET | Freq: Every day | ORAL | 3 refills | Status: DC

## 2023-07-10 MED ORDER — CYANOCOBALAMIN 1000 MCG/ML IJ SOLN
1000.0000 ug | INTRAMUSCULAR | 15 refills | Status: DC
Start: 2023-07-10 — End: 2023-07-11

## 2023-07-10 MED ORDER — SERTRALINE HCL 100 MG PO TABS: 100.0000 mg | ORAL_TABLET | Freq: Every day | ORAL | 3 refills | Status: DC

## 2023-07-10 MED ORDER — TIRZEPATIDE-WEIGHT MANAGEMENT 7.5 MG/0.5ML ~~LOC~~ SOLN: 7.5000 mg | SUBCUTANEOUS | 2 refills | Status: DC

## 2023-07-11 ENCOUNTER — Other Ambulatory Visit: Payer: Self-pay

## 2023-07-11 DIAGNOSIS — F32A Depression, unspecified: Secondary | ICD-10-CM

## 2023-07-11 DIAGNOSIS — E538 Deficiency of other specified B group vitamins: Secondary | ICD-10-CM

## 2023-07-11 MED ORDER — CYANOCOBALAMIN 1000 MCG/ML IJ SOLN: 1000.0000 ug | INTRAMUSCULAR | 15 refills | Status: AC

## 2023-07-11 MED ORDER — TIRZEPATIDE-WEIGHT MANAGEMENT 7.5 MG/0.5ML ~~LOC~~ SOLN: 7.5000 mg | SUBCUTANEOUS | 2 refills | Status: DC

## 2023-07-11 MED ORDER — SERTRALINE HCL 100 MG PO TABS: 100.0000 mg | ORAL_TABLET | Freq: Every day | ORAL | 3 refills | Status: AC

## 2023-07-11 MED ORDER — FAMOTIDINE 40 MG PO TABS: 40.0000 mg | ORAL_TABLET | Freq: Every day | ORAL | 5 refills | Status: AC

## 2023-08-07 ENCOUNTER — Encounter: Payer: Self-pay | Admitting: Family

## 2023-08-07 DIAGNOSIS — F32A Depression, unspecified: Secondary | ICD-10-CM

## 2023-08-07 DIAGNOSIS — B009 Herpesviral infection, unspecified: Secondary | ICD-10-CM

## 2023-08-07 DIAGNOSIS — Z0000 Encounter for general adult medical examination without abnormal findings: Secondary | ICD-10-CM

## 2023-08-07 DIAGNOSIS — Z6841 Body Mass Index (BMI) 40.0 and over, adult: Secondary | ICD-10-CM

## 2023-08-08 ENCOUNTER — Other Ambulatory Visit: Payer: Self-pay

## 2023-08-08 DIAGNOSIS — B009 Herpesviral infection, unspecified: Secondary | ICD-10-CM

## 2023-08-08 DIAGNOSIS — Z6841 Body Mass Index (BMI) 40.0 and over, adult: Secondary | ICD-10-CM

## 2023-08-08 DIAGNOSIS — F32A Depression, unspecified: Secondary | ICD-10-CM

## 2023-08-08 DIAGNOSIS — Z0000 Encounter for general adult medical examination without abnormal findings: Secondary | ICD-10-CM

## 2023-08-08 DIAGNOSIS — F419 Anxiety disorder, unspecified: Secondary | ICD-10-CM

## 2023-08-08 MED ORDER — ETONOGESTREL-ETHINYL ESTRADIOL 0.12-0.015 MG/24HR VA RING: 1.0000 | VAGINAL_RING | VAGINAL | 4 refills | Status: AC

## 2023-08-08 MED ORDER — BUPROPION HCL ER (XL) 150 MG PO TB24: 150.0000 mg | ORAL_TABLET | Freq: Every day | ORAL | 3 refills | Status: AC

## 2023-08-08 MED ORDER — BUPROPION HCL ER (XL) 150 MG PO TB24
150.0000 mg | ORAL_TABLET | Freq: Every day | ORAL | 3 refills | Status: DC
  Filled 2023-08-08: qty 30, 30d supply, fill #0

## 2023-08-08 MED ORDER — ETONOGESTREL-ETHINYL ESTRADIOL 0.12-0.015 MG/24HR VA RING
1.0000 | VAGINAL_RING | VAGINAL | 4 refills | Status: DC
  Filled 2023-08-08: qty 1, 28d supply, fill #0

## 2023-08-08 MED ORDER — VALACYCLOVIR HCL 1 G PO TABS: 1000.0000 mg | ORAL_TABLET | Freq: Every day | ORAL | 2 refills | Status: DC

## 2023-08-08 MED ORDER — VALACYCLOVIR HCL 1 G PO TABS
1000.0000 mg | ORAL_TABLET | Freq: Every day | ORAL | 2 refills | Status: DC
  Filled 2023-08-08: qty 30, 30d supply, fill #0

## 2023-08-08 NOTE — Telephone Encounter (Signed)
 Patient has been notified that her requested prescriptions were sent to [facility]. Accidentally sent to [facility] pharmacy, but spoke with someone from the outpatient pharmacy and had them cancelled and resent to correct pharmacy.

## 2023-08-21 ENCOUNTER — Ambulatory Visit (INDEPENDENT_AMBULATORY_CARE_PROVIDER_SITE_OTHER): Payer: Commercial Managed Care - PPO

## 2023-08-21 VITALS — BP 128/84 | HR 62 | Ht 62.0 in | Wt 172.4 lb

## 2023-08-21 DIAGNOSIS — Z23 Encounter for immunization: Secondary | ICD-10-CM

## 2023-08-21 NOTE — Progress Notes (Signed)
    NURSE VISIT NOTE  Subjective:    Patient ID: Angie Tucker, female    DOB: Oct 08, 1980, 43 y.o.   MRN: [id number]  HPI  Patient is a 43 y.o. G61P1021 female Married [place] American female who presents for her second Gardasil injection. Order to administer given by Angie Dunn, MD on 02/21/23.   Objective:    BP 128/84   Pulse 62   Ht 5\' 2"  (1.575 m)   Wt 172 lb 6.4 oz (78.2 kg)   LMP 08/03/2023   BMI 31.53 kg/m   42 y.o. LMP:  08/03/23  Contraception:  *Nuvaring Given by: Angie Tucker, CMA Site:  right deltoid     Assessment:   1. Need for HPV vaccine      Plan:   Patient will return in 4 months for third injection.    Vale Garrison, CMA

## 2023-08-21 NOTE — Patient Instructions (Signed)
Human Papillomavirus (HPV) Vaccine Injection What is this medication? HUMAN PAPILLOMAVIRUS VACCINE (HYOO muhn pap uh LOH muh vahy ruhs vak SEEN) reduces the risk of human papillomavirus (HPV). It does not treat HPV. It is still possible to get HPV after receiving this vaccine, but the symptoms may be less severe or not last as long. It works by helping your immune system learn how to fight off a future infection. This medicine may be used for other purposes; ask your health care provider or pharmacist if you have questions. COMMON BRAND NAME(S): Gardasil 9 What should I tell my care team before I take this medication? They need to know if you have any of these conditions: Fever Hemophilia HIV or AIDS Immune system problems Infection Low platelets An unusual reaction to human papillomavirus vaccine, yeast, other vaccines, other medications, foods, dyes, or preservatives Pregnant or trying to get pregnant Breastfeeding How should I use this medication? This vaccine is injected into a muscle. It is given by your care team. This vaccine requires 2 or 3 doses to get the full benefit. Set a reminder for when your next dose is due. A copy of the Vaccine Information Statement will be given before each vaccination. Be sure to read this information carefully each time. This sheet may change often. Talk to your care team about the use of this medication in children. While it may be prescribed for children as young as 9 years for selected conditions, precautions do apply. Overdosage: If you think you have taken too much of this medicine contact a poison control center or emergency room at once. NOTE: This medicine is only for you. Do not share this medicine with others. What if I miss a dose? Keep appointments for follow-up doses as directed. It is important not to miss your dose. Call your care team if you are unable to keep an appointment. What may interact with this medication? Certain medications  for arthritis Medications for organ transplant Medications to treat cancer Steroid medications, such as prednisone or cortisone This list may not describe all possible interactions. Give your health care provider a list of all the medicines, herbs, non-prescription drugs, or dietary supplements you use. Also tell them if you smoke, drink alcohol, or use illegal drugs. Some items may interact with your medicine. What should I watch for while using this medication? Visit your care team regularly. Report any side effects to your care team right away. This vaccine, like all vaccines, may not fully protect everyone. What side effects may I notice from receiving this medication? Side effects that you should report to your care team as soon as possible: Allergic reactions--skin rash, itching, hives, swelling of the face, lips, tongue, or throat Feeling faint or lightheaded Side effects that usually do not require medical attention (report these to your care team if they continue or are bothersome): Diarrhea Dizziness Fatigue Fever Headache Nausea Pain, redness, irritation, or bruising at the injection site This list may not describe all possible side effects. Call your doctor for medical advice about side effects. You may report side effects to FDA at 1-800-FDA-1088. Where should I keep my medication? This vaccine is only given by your care team. It will not be stored at home. NOTE: This sheet is a summary. It may not cover all possible information. If you have questions about this medicine, talk to your doctor, pharmacist, or health care provider.  2024 Elsevier/Gold Standard (2021-09-15 00:00:00)

## 2023-10-07 ENCOUNTER — Other Ambulatory Visit: Payer: Self-pay | Admitting: Family

## 2023-11-10 ENCOUNTER — Other Ambulatory Visit: Payer: Self-pay | Admitting: Family

## 2023-11-10 DIAGNOSIS — F418 Other specified anxiety disorders: Secondary | ICD-10-CM

## 2023-12-22 ENCOUNTER — Ambulatory Visit

## 2023-12-29 ENCOUNTER — Ambulatory Visit (INDEPENDENT_AMBULATORY_CARE_PROVIDER_SITE_OTHER)

## 2023-12-29 VITALS — BP 124/81 | HR 85 | Ht 62.0 in | Wt 167.0 lb

## 2023-12-29 DIAGNOSIS — Z23 Encounter for immunization: Secondary | ICD-10-CM | POA: Diagnosis not present

## 2023-12-29 NOTE — Progress Notes (Signed)
    NURSE VISIT NOTE  Subjective:    Patient ID: Angie Tucker, female    DOB: [birth year], 43 y.o.   MRN: [id number]  HPI  Patient is a 43 y.o. G41P1021 female Married [place] American female who presents for her third Gardasil injection. Order to administer given by Estil Mangle, MD on 02/21/23.   Objective:    BP 124/81   Pulse 85   Ht 5' 2 (1.575 m)   Wt 167 lb (75.8 kg)   LMP 12/19/2023   BMI 30.54 kg/m   43 y.o. LMP:  12/19/23  Contraception:  *Nuvaring Given by: Harlene Gander, CMA Site:  right deltoid  Lab Review  No results found for any visits on 12/29/23.    Assessment:   1. Need for HPV vaccine      Plan:   Patient will return in prn. Gardasil series complete.    Harlene Gander, CMA

## 2024-01-16 ENCOUNTER — Other Ambulatory Visit (HOSPITAL_COMMUNITY): Payer: Self-pay

## 2024-01-19 ENCOUNTER — Encounter: Payer: Self-pay | Admitting: Family

## 2024-01-19 ENCOUNTER — Telehealth: Payer: Self-pay

## 2024-01-19 ENCOUNTER — Telehealth (INDEPENDENT_AMBULATORY_CARE_PROVIDER_SITE_OTHER): Admitting: Family

## 2024-01-19 ENCOUNTER — Other Ambulatory Visit (HOSPITAL_COMMUNITY): Payer: Self-pay

## 2024-01-19 VITALS — Ht 62.0 in | Wt 165.0 lb

## 2024-01-19 DIAGNOSIS — E66811 Obesity, class 1: Secondary | ICD-10-CM

## 2024-01-19 DIAGNOSIS — Z6830 Body mass index (BMI) 30.0-30.9, adult: Secondary | ICD-10-CM | POA: Diagnosis not present

## 2024-01-19 DIAGNOSIS — M25532 Pain in left wrist: Secondary | ICD-10-CM | POA: Diagnosis not present

## 2024-01-19 MED ORDER — PREDNISONE 10 MG PO TABS: ORAL_TABLET | ORAL | 0 refills | Status: AC

## 2024-01-19 MED ORDER — ZEPBOUND 7.5 MG/0.5ML ~~LOC~~ SOAJ: 7.5000 mg | SUBCUTANEOUS | 2 refills | Status: DC

## 2024-01-19 NOTE — Assessment & Plan Note (Addendum)
 Acute on chronic. H/o wrist fracture.  Discussed need to imaging to ensure hardware in place without loosening. Referral to orthopedics. We agreed short burst of prednisone  4 day as we head into weekend due to discomfort. She will continue icing at home.

## 2024-01-19 NOTE — Progress Notes (Signed)
 Virtual Visit via Video Note  I connected with Lonell Jewels on 01/19/24 at 10:30 AM EDT by a video enabled telemedicine application and verified that I am speaking with the correct person using two identifiers. Location patient: home Location provider: work  Persons participating in the virtual visit: patient, provider  I discussed the limitations of evaluation and management by telemedicine and the availability of in person appointments. The patient expressed understanding and agreed to proceed.  HPI: Left wrist constant pain for 2 weeks  Aching Radial side pain Worse with extension  She has tried aleve with mixed results.   Denies trauma, fall on extended hand.    No numbness, fever, chills.   H/o fracture left wrist with hardware  She requests refill of zepbound  to generate PA.   ROS: See pertinent positives and negatives per HPI.  EXAM:  VITALS per patient if applicable: Ht 5' 2 (1.575 m)   Wt 165 lb (74.8 kg)   LMP  (LMP Unknown)   BMI 30.18 kg/m  BP Readings from Last 3 Encounters:  12/29/23 124/81  08/21/23 128/84  05/05/23 130/76   Wt Readings from Last 3 Encounters:  01/19/24 165 lb (74.8 kg)  12/29/23 167 lb (75.8 kg)  08/21/23 172 lb 6.4 oz (78.2 kg)    GENERAL: alert, oriented, appears well and in no acute distress  HEENT: atraumatic, conjunttiva clear, no obvious abnormalities on inspection of external nose and ears  NECK: normal movements of the head and neck  LUNGS: on inspection no signs of respiratory distress, breathing rate appears normal, no obvious gross SOB, gasping or wheezing  CV: no obvious cyanosis  MS: moves all visible extremities without noticeable abnormality. No obvious edema of left wrist. She is able to perform flexion and extension, albeit with pain on video.   PSYCH/NEURO: pleasant and cooperative, no obvious depression or anxiety, speech and thought processing grossly intact  ASSESSMENT AND PLAN: Left wrist  pain Assessment & Plan: Acute on chronic. H/o wrist fracture.  Discussed need to imaging to ensure hardware in place without loosening. Referral to orthopedics. We agreed short burst of prednisone  4 day as we head into weekend due to discomfort. She will continue icing at home.   Orders: -     Ambulatory referral to Orthopedic Surgery -     predniSONE ; Take 40 mg by mouth on day 1, then taper 10 mg daily until gone  Dispense: 10 tablet; Refill: 0  Obesity (BMI 30.0-34.9) -     Zepbound ; Inject 7.5 mg into the skin once a week.  Dispense: 2 mL; Refill: 2     -we discussed possible serious and likely etiologies, options for evaluation and workup, limitations of telemedicine visit vs in person visit, treatment, treatment risks and precautions. Pt prefers to treat via telemedicine empirically rather then risking or undertaking an in person visit at this moment.    I discussed the assessment and treatment plan with the patient. The patient was provided an opportunity to ask questions and all were answered. The patient agreed with the plan and demonstrated an understanding of the instructions.   The patient was advised to call back or seek an in-person evaluation if the symptoms worsen or if the condition fails to improve as anticipated.  Advised if desired AVS can be mailed or viewed via MyChart if Mychart user.   Rollene Northern, FNP

## 2024-01-19 NOTE — Telephone Encounter (Signed)
 Pharmacy Patient Advocate Encounter   Received notification from CoverMyMeds that prior authorization for Zepbound  7.5MG /0.5ML pen-injectors  is required/requested.   Insurance verification completed.   The patient is insured through [facility].   Per test claim: PA required; PA submitted to above mentioned insurance via Latent Key/confirmation #/EOC [id number] Status is pending

## 2024-01-23 DIAGNOSIS — E66811 Obesity, class 1: Secondary | ICD-10-CM

## 2024-01-23 NOTE — Telephone Encounter (Signed)
 Pharmacy Patient Advocate Encounter  Received notification from [facility] that Prior Authorization for Zepbound  7.5MG /0.5ML pen-injectors  has been DENIED.  See denial reason below. No denial letter attached in CMM. Will attach denial letter to Media tab once received.   PA #/Case ID/Reference #: [id number] Your plan only covers this drug when you EXPERIENCE BENEFITS FROM TAKING THE DRUG. WE HAVE DENIED YOUR REQUEST BECAUSE YOU DID NOT HAVE GOOD OUTCOMES FROM THE DRUG. We reviewed the information we had. Your request has been denied. Your doctor can send us  any new or missing information for us  to review. For this drug, you may have to meet other criteria. You can request the drug policy for more details. You can also request other plan documents for your review.

## 2024-01-23 NOTE — Telephone Encounter (Signed)
 LVM and sent my chart message also

## 2024-01-26 NOTE — Telephone Encounter (Signed)
 Patient saw mychart message sent on Last read by Lonell Jewels at 9:26AM on 01/23/2024.

## 2024-01-31 ENCOUNTER — Other Ambulatory Visit: Payer: Self-pay

## 2024-01-31 MED ORDER — WEGOVY 1 MG/0.5ML ~~LOC~~ SOAJ: 1.0000 mg | Freq: Every day | SUBCUTANEOUS | 2 refills | Status: DC

## 2024-02-01 ENCOUNTER — Encounter: Payer: Self-pay | Admitting: *Deleted

## 2024-02-01 ENCOUNTER — Other Ambulatory Visit: Payer: Self-pay | Admitting: Orthopaedic Surgery

## 2024-02-01 DIAGNOSIS — M25532 Pain in left wrist: Secondary | ICD-10-CM

## 2024-02-05 ENCOUNTER — Ambulatory Visit
Admission: RE | Admit: 2024-02-05 | Discharge: 2024-02-05 | Disposition: A | Discharge status: Home / Self Care | Source: Ambulatory Visit | Attending: Orthopaedic Surgery | Admitting: Orthopaedic Surgery

## 2024-02-05 DIAGNOSIS — M25532 Pain in left wrist: Secondary | ICD-10-CM | POA: Diagnosis not present

## 2024-02-05 DIAGNOSIS — S52612D Displaced fracture of left ulna styloid process, subsequent encounter for closed fracture with routine healing: Secondary | ICD-10-CM | POA: Diagnosis not present

## 2024-02-07 ENCOUNTER — Telehealth: Payer: Self-pay

## 2024-02-07 ENCOUNTER — Other Ambulatory Visit (HOSPITAL_COMMUNITY): Payer: Self-pay

## 2024-02-07 NOTE — Telephone Encounter (Signed)
 Pharmacy Patient Advocate Encounter   Received notification from Onbase that prior authorization for Wegovy  1MG /0.5ML auto-injectors  is required/requested.   Insurance verification completed.   The patient is insured through [facility].   Per test claim: PA required; PA submitted to above mentioned insurance via Latent Key/confirmation #/EOC [facility] Status is pending

## 2024-02-07 NOTE — Telephone Encounter (Signed)
 Pharmacy Patient Advocate Encounter  Received notification from [facility] that Prior Authorization for  Wegovy  1MG /0.5ML auto-injectors  has been APPROVED from 02/07/24 to 09/06/24. Ran test claim, Copay is $24.99. This test claim was processed through [facility]- copay amounts may vary at other pharmacies due to pharmacy/plan contracts, or as the patient moves through the different stages of their insurance plan.   PA #/Case ID/Reference #: [id number]

## 2024-02-08 NOTE — Telephone Encounter (Signed)
 NOTED

## 2024-02-08 NOTE — Addendum Note (Signed)
 Addended by: Antoninette Lerner on: 02/08/2024 09:08 AM   Modules accepted: Orders

## 2024-02-09 ENCOUNTER — Other Ambulatory Visit (HOSPITAL_COMMUNITY): Payer: Self-pay

## 2024-02-09 DIAGNOSIS — M25532 Pain in left wrist: Secondary | ICD-10-CM | POA: Diagnosis not present

## 2024-02-27 DIAGNOSIS — M654 Radial styloid tenosynovitis [de Quervain]: Secondary | ICD-10-CM | POA: Diagnosis not present

## 2024-03-15 ENCOUNTER — Other Ambulatory Visit: Payer: Self-pay | Admitting: Family

## 2024-03-15 DIAGNOSIS — B009 Herpesviral infection, unspecified: Secondary | ICD-10-CM

## 2024-03-26 DIAGNOSIS — T84192A Other mechanical complication of internal fixation device of bone of right forearm, initial encounter: Secondary | ICD-10-CM | POA: Diagnosis not present

## 2024-03-26 DIAGNOSIS — M654 Radial styloid tenosynovitis [de Quervain]: Secondary | ICD-10-CM | POA: Diagnosis not present

## 2024-04-01 DIAGNOSIS — M65832 Other synovitis and tenosynovitis, left forearm: Secondary | ICD-10-CM | POA: Diagnosis not present

## 2024-04-01 DIAGNOSIS — T8484XA Pain due to internal orthopedic prosthetic devices, implants and grafts, initial encounter: Secondary | ICD-10-CM | POA: Diagnosis not present

## 2024-04-01 DIAGNOSIS — M65132 Other infective (teno)synovitis, left wrist: Secondary | ICD-10-CM | POA: Diagnosis not present

## 2024-04-01 DIAGNOSIS — Z472 Encounter for removal of internal fixation device: Secondary | ICD-10-CM | POA: Diagnosis not present
